# Patient Record
Sex: Female | Born: 1990 | Race: Black or African American | Hispanic: No | Marital: Single | Smoking: Never smoker
Health system: Southern US, Community
[De-identification: ages and names within clinical notes are randomized; demographics above are authoritative.]

## PROBLEM LIST (undated history)

## (undated) ENCOUNTER — Emergency Department (HOSPITAL_COMMUNITY): Discharge status: Against Medical Advice | Payer: Self-pay

## (undated) DIAGNOSIS — J45909 Unspecified asthma, uncomplicated: Secondary | ICD-10-CM

## (undated) DIAGNOSIS — R7303 Prediabetes: Secondary | ICD-10-CM

## (undated) HISTORY — PX: NO PAST SURGERIES: SHX2092

---
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
## 2012-07-30 ENCOUNTER — Encounter (HOSPITAL_COMMUNITY): Payer: Self-pay | Admitting: Emergency Medicine

## 2012-07-30 ENCOUNTER — Emergency Department (HOSPITAL_COMMUNITY)
Admission: EM | Admit: 2012-07-30 | Discharge: 2012-07-30 | Disposition: A | Discharge status: Home / Self Care | Payer: Self-pay | Attending: Emergency Medicine | Admitting: Emergency Medicine

## 2012-07-30 DIAGNOSIS — R059 Cough, unspecified: Secondary | ICD-10-CM | POA: Insufficient documentation

## 2012-07-30 DIAGNOSIS — R05 Cough: Secondary | ICD-10-CM | POA: Insufficient documentation

## 2012-07-30 DIAGNOSIS — J45901 Unspecified asthma with (acute) exacerbation: Secondary | ICD-10-CM | POA: Insufficient documentation

## 2012-07-30 DIAGNOSIS — J4531 Mild persistent asthma with (acute) exacerbation: Secondary | ICD-10-CM

## 2012-07-30 HISTORY — DX: Unspecified asthma, uncomplicated: J45.909

## 2012-07-30 MED ORDER — PREDNISONE 20 MG PO TABS: 60.0000 mg | ORAL_TABLET | Freq: Every day | ORAL | Status: DC

## 2012-07-30 MED ORDER — ALBUTEROL SULFATE HFA 108 (90 BASE) MCG/ACT IN AERS: 1.0000 | INHALATION_SPRAY | Freq: Four times a day (QID) | RESPIRATORY_TRACT | Status: DC | PRN

## 2012-07-30 MED ORDER — ALBUTEROL SULFATE HFA 108 (90 BASE) MCG/ACT IN AERS
2.0000 | INHALATION_SPRAY | Freq: Once | RESPIRATORY_TRACT | Status: AC
  Administered 2012-07-30: 2 via RESPIRATORY_TRACT
  Filled 2012-07-30: qty 6.7

## 2012-07-30 NOTE — ED Provider Notes (Signed)
   History    CSN: [id number] Arrival date & time 07/30/12  1221  First MD Initiated Contact with Patient 07/30/12 1238     Chief Complaint  Patient presents with  . Asthma   (Consider location/radiation/quality/duration/timing/severity/associated sxs/prior Treatment) HPI Comments: Nicole White is a patient with hx of asthma who presents with hx of worsening wheezing and dyspnea that started this past week gradually and has been constant.  She ran out of her inhaler yesterday.  She endorses some productive cough that she usually has with her asthma-exacerbations.  She denies ICU stays or intubations.  Albuterol improves her sx.  Nothing relieves them.  She denies any pain  Past Medical History  Diagnosis Date  . Asthma    No past surgical history on file. No family history on file. History  Substance Use Topics  . Smoking status: Never Smoker   . Smokeless tobacco: Never Used  . Alcohol Use: No   OB History   Grav Para Term Preterm Abortions TAB SAB Ect Mult Living                 Review of Systems  Unable to perform ROS Constitutional: Negative for diaphoresis and fatigue.  HENT: Negative for ear pain, congestion and rhinorrhea.   Eyes: Negative.   Respiratory:       See history of present illness  Cardiovascular: Negative.   Gastrointestinal: Negative.   Endocrine: Negative.   Genitourinary: Negative.   Musculoskeletal: Negative.   Skin: Negative.   Allergic/Immunologic: Negative.   Neurological: Negative.   Hematological: Negative.   Psychiatric/Behavioral: Negative.   All other systems reviewed and are negative.    Allergies  Review of patient's allergies indicates no known allergies.  Home Medications  No current outpatient prescriptions on file. BP 128/85  Pulse 93  Temp(Src) 98.3 F (36.8 C) (Oral)  Resp 20  SpO2 99%  LMP 06/30/2012 Physical Exam PHYSICAL EXAM  Nursing note and vitals reviewed. Constitutional: Patient is oriented to person,  place, and time. Patient appears well-developed and well-nourished. No distress.  Head: Normocephalic and atraumatic.  Right Ear: External ear normal.  Left Ear: External ear normal.  Eyes: Conjunctivae injected and EOM are normal. Pupils are equal, round, and reactive to light. No scleral icterus.  Neck: Normal range of motion. Neck supple. No JVD present.  Cardiovascular: Normal rate.  Nml rhythm Pulmonary/Chest: Effort normal. No respiratory distress.  Abd/GI: Patient exhibits no distension.  Musculoskeletal: Normal range of motion. Patient exhibits no edema.  Neurological: Patient is alert and oriented to person, place, and time.  Skin: Patient is not diaphoretic. No pallor.  Psychiatric: Patient has a normal mood and affect. Patient behavior is normal. Judgment and thought content normal.    ED Course  Procedures (including critical care time) Labs Reviewed - No data to display No results found. No diagnosis found.  MDM  Patient is with persistent asthmatic symptoms and has run out of her inhaler. She has no concerning findings on exam including fever tachypnea or tachycardia. She appears very well. Her lung sounds are clear, and I do not think that she has pneumonia. Will treat with by mouth prednisone and albuterol MDI. Given her resources for followup as well as strict return precautions.  Ashby Dawes, MD 07/30/12 1345

## 2012-07-30 NOTE — ED Notes (Signed)
Ran out of inhaler, asthma symptoms. Called EMS yesterday for same was given a breathing treatment and decided not to come to the hospital. EMS gave a breathing treatment today, reports clear lung sounds.

## 2012-08-06 ENCOUNTER — Emergency Department (HOSPITAL_COMMUNITY)
Admission: EM | Admit: 2012-08-06 | Discharge: 2012-08-06 | Disposition: A | Discharge status: Home / Self Care | Payer: Self-pay | Attending: Emergency Medicine | Admitting: Emergency Medicine

## 2012-08-06 ENCOUNTER — Encounter (HOSPITAL_COMMUNITY): Payer: Self-pay | Admitting: Emergency Medicine

## 2012-08-06 DIAGNOSIS — J4521 Mild intermittent asthma with (acute) exacerbation: Secondary | ICD-10-CM

## 2012-08-06 DIAGNOSIS — Z79899 Other long term (current) drug therapy: Secondary | ICD-10-CM | POA: Insufficient documentation

## 2012-08-06 DIAGNOSIS — Z760 Encounter for issue of repeat prescription: Secondary | ICD-10-CM | POA: Insufficient documentation

## 2012-08-06 DIAGNOSIS — J45901 Unspecified asthma with (acute) exacerbation: Secondary | ICD-10-CM | POA: Insufficient documentation

## 2012-08-06 MED ORDER — ALBUTEROL SULFATE HFA 108 (90 BASE) MCG/ACT IN AERS
2.0000 | INHALATION_SPRAY | RESPIRATORY_TRACT | Status: DC
  Administered 2012-08-06: 2 via RESPIRATORY_TRACT
  Filled 2012-08-06: qty 6.7

## 2012-08-06 MED ORDER — ALBUTEROL SULFATE HFA 108 (90 BASE) MCG/ACT IN AERS: 1.0000 | INHALATION_SPRAY | Freq: Four times a day (QID) | RESPIRATORY_TRACT | Status: DC | PRN

## 2012-08-06 MED ORDER — ALBUTEROL SULFATE (5 MG/ML) 0.5% IN NEBU
2.5000 mg | INHALATION_SOLUTION | Freq: Once | RESPIRATORY_TRACT | Status: AC
  Administered 2012-08-06: 2.5 mg via RESPIRATORY_TRACT
  Filled 2012-08-06: qty 0.5

## 2012-08-06 MED ORDER — PREDNISONE 20 MG PO TABS
60.0000 mg | ORAL_TABLET | Freq: Once | ORAL | Status: AC
  Administered 2012-08-06: 60 mg via ORAL
  Filled 2012-08-06: qty 3

## 2012-08-06 MED ORDER — PREDNISONE 10 MG PO TABS: ORAL_TABLET | ORAL | Status: DC

## 2012-08-06 NOTE — ED Notes (Signed)
Lung fields improved after breathing treatment. Clear throughout.

## 2012-08-06 NOTE — ED Notes (Signed)
PA at bedside.

## 2012-08-06 NOTE — ED Provider Notes (Signed)
This chart was scribed for non-physician practitioner Trisha Mangle, PA-C working with Derwood Kaplan, MD, by Candelaria Stagers, ED Scribe. This patient was seen in room TR09C/TR09C and the patient's care was started at 5:45 PM  CSN: [id number]     Arrival date & time 08/06/12  1433 History     First MD Initiated Contact with Patient 08/06/12 1741     Chief Complaint  Patient presents with  . Medication Refill    The history is provided by the patient. No language interpreter was used.   HPI Comments: Nicole White is a 22 y.o. female with h/o asthma who presents to the Emergency Department complaining of trouble breathing over the last few days reporting she has been out of her prescribed albuterol.  Pt is unable to get the albuterol refilled due to cost.  She is experiencing wheezing.  Nothing seems to make the sx better or worse.      No PCP  Past Medical History  Diagnosis Date  . Asthma    History reviewed. No pertinent past surgical history. No family history on file. History  Substance Use Topics  . Smoking status: Never Smoker   . Smokeless tobacco: Never Used  . Alcohol Use: No   OB History   Grav Para Term Preterm Abortions TAB SAB Ect Mult Living                 Review of Systems  Respiratory: Positive for shortness of breath and wheezing.   All other systems reviewed and are negative.    Allergies  Peanut-containing drug products and Coconut flavor  Home Medications   Current Outpatient Rx  Name  Route  Sig  Dispense  Refill  . albuterol (PROVENTIL HFA;VENTOLIN HFA) 108 (90 BASE) MCG/ACT inhaler   Inhalation   Inhale 1-2 puffs into the lungs every 6 (six) hours as needed for wheezing.   1 Inhaler   0   . predniSONE (DELTASONE) 20 MG tablet   Oral   Take 3 tablets (60 mg total) by mouth daily.   15 tablet   0    BP 139/78  Pulse 100  Temp(Src) 98.3 F (36.8 C) (Oral)  Resp 18  Ht 5' (1.524 m)  Wt 126 lb (57.153 kg)  BMI 24.61 kg/m2  LMP  06/30/2012  Physical Exam  Nursing note and vitals reviewed. Constitutional: She is oriented to person, place, and time. She appears well-developed and well-nourished. No distress.  HENT:  Head: Normocephalic and atraumatic.  Eyes: EOM are normal.  Neck: Neck supple. No tracheal deviation present.  Cardiovascular: Normal rate.   Pulmonary/Chest: Effort normal. No respiratory distress. She has wheezes (wheezes to all lobes).  Musculoskeletal: Normal range of motion.  Neurological: She is alert and oriented to person, place, and time.  Skin: Skin is warm and dry.  Psychiatric: She has a normal mood and affect. Her behavior is normal.    ED Course   Procedures  DIAGNOSTIC STUDIES: Oxygen Saturation is 100% normal by my interpretation.    COORDINATION OF CARE:    5:46 PM Discussed course of care with pt which includes albuterol inhaler.  Pt understands and agrees.   Labs Reviewed - No data to display No results found. 1. Asthma exacerbation attacks, mild intermittent     MDM   I personally performed the services in this documentation, which was scribed in my presence.  The recorded information has been reviewed and considered.   Barnet Pall.   Lonia Skinner  [place], [place] 08/06/12 1804

## 2012-08-06 NOTE — ED Notes (Signed)
Pt. Stated, I've had trouble with my breathing since I've been out of my inhaler.  I don't have insurance and its too expensive.  My breathing seems to be triggered by all kinds of different things even laughing.

## 2012-08-06 NOTE — ED Notes (Signed)
Pt. Here on July 20 for the same reason and did not refill her RX.

## 2012-08-06 NOTE — ED Notes (Signed)
Patient states unable to get her medications filled due to no insurance and no money. Requesting samples. Educated her that the ER does not have samples, but will get Case Management to come talk with her about assistance options.

## 2012-08-07 NOTE — ED Provider Notes (Signed)
Medical screening examination/treatment/procedure(s) were performed by non-physician practitioner and as supervising physician I was immediately available for consultation/collaboration.  Breylen Agyeman, MD 08/07/12 0033 

## 2012-08-16 ENCOUNTER — Emergency Department (INDEPENDENT_AMBULATORY_CARE_PROVIDER_SITE_OTHER)
Admission: EM | Admit: 2012-08-16 | Discharge: 2012-08-16 | Disposition: A | Discharge status: Home / Self Care | Payer: Self-pay | Source: Home / Self Care

## 2012-08-16 ENCOUNTER — Encounter (HOSPITAL_COMMUNITY): Payer: Self-pay

## 2012-08-16 DIAGNOSIS — J45901 Unspecified asthma with (acute) exacerbation: Secondary | ICD-10-CM

## 2012-08-16 DIAGNOSIS — J45909 Unspecified asthma, uncomplicated: Secondary | ICD-10-CM

## 2012-08-16 LAB — POCT PREGNANCY, URINE: Preg Test, Ur: NEGATIVE

## 2012-08-16 LAB — POCT URINALYSIS DIP (DEVICE)
Bilirubin Urine: NEGATIVE
Ketones, ur: NEGATIVE mg/dL

## 2012-08-16 MED ORDER — ALBUTEROL SULFATE (5 MG/ML) 0.5% IN NEBU
2.5000 mg | INHALATION_SOLUTION | Freq: Once | RESPIRATORY_TRACT | Status: AC
  Administered 2012-08-16: 2.5 mg via RESPIRATORY_TRACT

## 2012-08-16 MED ORDER — PREDNISONE 20 MG PO TABS: 40.0000 mg | ORAL_TABLET | Freq: Every day | ORAL | Status: DC

## 2012-08-16 MED ORDER — ALBUTEROL SULFATE (5 MG/ML) 0.5% IN NEBU
INHALATION_SOLUTION | RESPIRATORY_TRACT | Status: AC
  Filled 2012-08-16: qty 0.5

## 2012-08-16 MED ORDER — ALBUTEROL SULFATE HFA 108 (90 BASE) MCG/ACT IN AERS: 1.0000 | INHALATION_SPRAY | Freq: Four times a day (QID) | RESPIRATORY_TRACT | Status: DC | PRN

## 2012-08-16 NOTE — ED Notes (Signed)
C/o her asthma is flarring up; has Rx , but has no insurance, and did not fill

## 2012-08-16 NOTE — ED Provider Notes (Signed)
Nicole White is a 22 y.o. female who presents to Urgent Care today for asthma. Patient has a past medical history significant for asthma. She's had multiple frequent asthma exacerbations in the last month or 2. She frequently goes to the emergency room for breathing treatments. However she does not have health insurance and has not filled her prednisone or follow up with the indigent care clinic. She is back today with wheezing and mild shortness of breath. She denies any chest pains or palpitations and feels well otherwise. Her current symptoms are consistent with asthma exacerbation. She has run out of her sample albuterol inhaler.     PMH reviewed. Asthma  History  Substance Use Topics  . Smoking status: Never Smoker   . Smokeless tobacco: Never Used  . Alcohol Use: No   ROS as above Medications reviewed. No current facility-administered medications for this encounter.   Current Outpatient Prescriptions  Medication Sig Dispense Refill  . albuterol (PROVENTIL HFA;VENTOLIN HFA) 108 (90 BASE) MCG/ACT inhaler Inhale 1-2 puffs into the lungs every 6 (six) hours as needed for wheezing.  1 Inhaler  2  . predniSONE (DELTASONE) 20 MG tablet Take 2 tablets (40 mg total) by mouth daily.  20 tablet  0    Exam:  BP 108/62  Pulse 75  Temp(Src) 98.2 F (36.8 C) (Oral)  Resp 16  SpO2 97%  LMP 06/30/2012 Gen: Well NAD HEENT: EOMI,  MMM Lungs:  decreased breath sounds with expiratory wheezing. Normal work of breathing  Heart: RRR no MRG Abd: NABS, NT, ND Exts: Non edematous BL  LE, warm and well perfused.   Results for orders placed during the hospital encounter of 08/16/12 (from the past 24 hour(s))  POCT URINALYSIS DIP (DEVICE)     Status: Abnormal   Collection Time    08/16/12 11:18 AM      Result Value Range   Glucose, UA NEGATIVE  NEGATIVE mg/dL   Bilirubin Urine NEGATIVE  NEGATIVE   Ketones, ur NEGATIVE  NEGATIVE mg/dL   Specific Gravity, Urine >=1.030  1.005 - 1.030   Hgb urine  dipstick TRACE (*) NEGATIVE   pH 6.0  5.0 - 8.0   Protein, ur NEGATIVE  NEGATIVE mg/dL   Urobilinogen, UA 0.2  0.0 - 1.0 mg/dL   Nitrite NEGATIVE  NEGATIVE   Leukocytes, UA TRACE (*) NEGATIVE  POCT PREGNANCY, URINE     Status: None   Collection Time    08/16/12 11:20 AM      Result Value Range   Preg Test, Ur NEGATIVE  NEGATIVE   No results found.  Patient had subjective and objective improvement of her symptoms and physical exam findings with albuterol nebulizer treatment.  Assessment and Plan: 22 y.o. female with had a lengthy discussion with the patient regarding the importance of followup for asthma.  I informed her that prednisone is quite cheap and she should be able to afford it. Additionally discussed the importance of filling her albuterol inhaler if possible.  However I suspect the most of my time discussing the importance of proper followup. I've referred her to the indigent care clinic and stress the importance of follow up.  Discussed the pathophysiology of asthma and why controller medications and prednisone are very important.  Discussed warning signs or symptoms. Please see discharge instructions. Patient expresses understanding.      Rodolph Bong, MD 08/16/12 [phone]

## 2012-09-24 ENCOUNTER — Emergency Department (HOSPITAL_COMMUNITY): Payer: Self-pay

## 2012-09-24 ENCOUNTER — Encounter (HOSPITAL_COMMUNITY): Payer: Self-pay

## 2012-09-24 ENCOUNTER — Emergency Department (HOSPITAL_COMMUNITY)
Admission: EM | Admit: 2012-09-24 | Discharge: 2012-09-24 | Disposition: A | Discharge status: Home / Self Care | Payer: Self-pay | Attending: Emergency Medicine | Admitting: Emergency Medicine

## 2012-09-24 DIAGNOSIS — R059 Cough, unspecified: Secondary | ICD-10-CM | POA: Insufficient documentation

## 2012-09-24 DIAGNOSIS — M79641 Pain in right hand: Secondary | ICD-10-CM

## 2012-09-24 DIAGNOSIS — M25549 Pain in joints of unspecified hand: Secondary | ICD-10-CM | POA: Insufficient documentation

## 2012-09-24 DIAGNOSIS — Z79899 Other long term (current) drug therapy: Secondary | ICD-10-CM | POA: Insufficient documentation

## 2012-09-24 DIAGNOSIS — J45901 Unspecified asthma with (acute) exacerbation: Secondary | ICD-10-CM | POA: Insufficient documentation

## 2012-09-24 DIAGNOSIS — R0789 Other chest pain: Secondary | ICD-10-CM | POA: Insufficient documentation

## 2012-09-24 DIAGNOSIS — R05 Cough: Secondary | ICD-10-CM | POA: Insufficient documentation

## 2012-09-24 DIAGNOSIS — Z3202 Encounter for pregnancy test, result negative: Secondary | ICD-10-CM | POA: Insufficient documentation

## 2012-09-24 MED ORDER — PREDNISONE 20 MG PO TABS: 60.0000 mg | ORAL_TABLET | Freq: Every day | ORAL | Status: DC

## 2012-09-24 MED ORDER — PREDNISONE 20 MG PO TABS
60.0000 mg | ORAL_TABLET | Freq: Once | ORAL | Status: AC
  Administered 2012-09-24: 60 mg via ORAL
  Filled 2012-09-24: qty 3

## 2012-09-24 NOTE — ED Notes (Addendum)
Pt states she awakened this morning @ 9 am with SOB.  Pt did not have an albuterol inhaler at home so did not self-medicate. Pt in NAD at this time.  Pt's lung sounds are clear in lower lobes with end expiratory wheezing in bilat lower lobes.  Good peripheral pulses in all 4 extremities.  Pt denies N/V/D and fever.  Pt states she has occasional cough productive of gray sputum.  Pt c/o pain in proximal knuckle of right index finger.
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
## 2012-09-24 NOTE — ED Notes (Signed)
Per EMS-Patient was seen by EMS las night for asthma exacerbation and was given an Albuterol treatment. Patient refused transport at that time. Today, patient was wheezing and called EMS. Patient received albuterol and Atrovent x 2 prior to arrival to the ED.

## 2012-10-29 ENCOUNTER — Emergency Department (HOSPITAL_COMMUNITY)
Admission: EM | Admit: 2012-10-29 | Discharge: 2012-10-29 | Disposition: A | Discharge status: Home / Self Care | Payer: Self-pay | Attending: Emergency Medicine | Admitting: Emergency Medicine

## 2012-10-29 ENCOUNTER — Encounter (HOSPITAL_COMMUNITY): Payer: Self-pay | Admitting: Emergency Medicine

## 2012-10-29 ENCOUNTER — Emergency Department (HOSPITAL_COMMUNITY): Payer: Self-pay

## 2012-10-29 DIAGNOSIS — J45901 Unspecified asthma with (acute) exacerbation: Secondary | ICD-10-CM | POA: Insufficient documentation

## 2012-10-29 DIAGNOSIS — Z79899 Other long term (current) drug therapy: Secondary | ICD-10-CM | POA: Insufficient documentation

## 2012-10-29 DIAGNOSIS — R0789 Other chest pain: Secondary | ICD-10-CM | POA: Insufficient documentation

## 2012-10-29 DIAGNOSIS — IMO0002 Reserved for concepts with insufficient information to code with codable children: Secondary | ICD-10-CM | POA: Insufficient documentation

## 2012-10-29 MED ORDER — PREDNISONE 20 MG PO TABS: 40.0000 mg | ORAL_TABLET | Freq: Every day | ORAL | Status: DC

## 2012-10-29 MED ORDER — ALBUTEROL SULFATE HFA 108 (90 BASE) MCG/ACT IN AERS
2.0000 | INHALATION_SPRAY | Freq: Once | RESPIRATORY_TRACT | Status: AC
  Administered 2012-10-29: 2 via RESPIRATORY_TRACT
  Filled 2012-10-29: qty 6.7

## 2012-10-29 NOTE — ED Notes (Signed)
She phoned EMS d/t "wheezing real bad".  They had been to see her already this morning, and after a nebulizer treatment, she decided she felt better and did not come to hospital at that time.  She further tells [place] she "ran out of my (aluterol) inhaler a while ago".  She is finishing an albuterol/atrovent h.h.n. Upon arrival in E.D. Her skin is normal, warm and dry, and she is in no distress.

## 2012-10-29 NOTE — ED Notes (Signed)
She milingly tell me she feels "much better".

## 2012-10-29 NOTE — ED Notes (Signed)
Bed: WA06 Expected date:  Expected time:  Means of arrival:  Comments: 22 y/o F asthma

## 2012-11-02 NOTE — ED Provider Notes (Signed)
CSN: [id number]     Arrival date & time 10/29/12  1403 History   First MD Initiated Contact with Patient 10/29/12 1408     Chief Complaint  Patient presents with  . Asthma   (Consider location/radiation/quality/duration/timing/severity/associated sxs/prior Treatment) HPI  22 year old female with shortness of breath. Patient has a past history of asthma. She states that her current symptoms feel similar. He states that this morning she began having severe wheezing. Initially called EMS received an albuterol treatment and declined transport to the emergency room. She began feeling poorly again surgical EMS back. Productive cough. No fevers or chills. No unusual leg pain or swelling. Has never been to the ICU or intubated because of her asthma. Some mild chest tightness, but not really pain. No sick contacts. Denies drug use.  Past Medical History  Diagnosis Date  . Asthma    History reviewed. No pertinent past surgical history. Family History  Problem Relation Age of Onset  . Asthma Father   . Autism Brother    History  Substance Use Topics  . Smoking status: Never Smoker   . Smokeless tobacco: Never Used  . Alcohol Use: No   OB History   Grav Para Term Preterm Abortions TAB SAB Ect Mult Living                 Review of Systems  All systems reviewed and negative, other than as noted in HPI.   Allergies  Peanut-containing drug products and Coconut flavor  Home Medications   Current Outpatient Rx  Name  Route  Sig  Dispense  Refill  . albuterol (PROVENTIL HFA;VENTOLIN HFA) 108 (90 BASE) MCG/ACT inhaler   Inhalation   Inhale 1-2 puffs into the lungs every 6 (six) hours as needed for wheezing.   1 Inhaler   2   . ibuprofen (ADVIL,MOTRIN) 200 MG tablet   Oral   Take 400 mg by mouth every 6 (six) hours as needed for pain.         . predniSONE (DELTASONE) 20 MG tablet   Oral   Take 2 tablets (40 mg total) by mouth daily.   10 tablet   0    BP 130/76  Pulse 70   Temp(Src) 98 F (36.7 C) (Oral)  Resp 16  SpO2 100% Physical Exam  Nursing note and vitals reviewed. Constitutional: She appears well-developed and well-nourished. No distress.  HENT:  Head: Normocephalic and atraumatic.  Eyes: Conjunctivae are normal. Right eye exhibits no discharge. Left eye exhibits no discharge.  Neck: Neck supple.  Cardiovascular: Normal rate, regular rhythm and normal heart sounds.  Exam reveals no gallop and no friction rub.   No murmur heard. Pulmonary/Chest: Effort normal and breath sounds normal. No respiratory distress.  Abdominal: Soft. She exhibits no distension. There is no tenderness.  Musculoskeletal: She exhibits no edema and no tenderness.  Lower extremities symmetric as compared to each other. No calf tenderness. Negative Homan's. No palpable cords.   Neurological: She is alert.  Skin: Skin is warm and dry.  Psychiatric: She has a normal mood and affect. Her behavior is normal. Thought content normal.    ED Course  Procedures (including critical care time) Labs Review Labs Reviewed - No data to display Imaging Review No results found.  Dg Chest 2 View  10/29/2012   CLINICAL DATA:  22 year old female with chest pain, shortness of breath. Initial encounter.  EXAM: CHEST  2 VIEW  COMPARISON:  09/24/2012 and earlier.  FINDINGS: Hair artifact. Lung  volumes are stable and within normal limits. Normal cardiac size and mediastinal contours. Visualized tracheal air column is within normal limits. The lungs are clear. No pneumothorax or effusion. No osseous abnormality identified.  IMPRESSION: Negative, no acute cardiopulmonary abnormality.   Electronically Signed   By: Augusto Gamble M.D.   On: 10/29/2012 14:47   EKG Interpretation   None       MDM   1. Asthma exacerbation    22 year old female presenting for her what I suspect is to obtain an albuterol inhaler. She states that she is currently out. She has a history of asthma and perhaps some mild  wheezing on her exam here today. She has been her breathing is generally very well appearing. Inhaler was provided to her. Short course of steroids. Discussed the need for a primary care provider. Resources were discussed.    Raeford Razor, MD 11/02/12 1355

## 2012-11-20 ENCOUNTER — Emergency Department (HOSPITAL_COMMUNITY)
Admission: EM | Admit: 2012-11-20 | Discharge: 2012-11-20 | Disposition: A | Discharge status: Home / Self Care | Payer: Self-pay | Attending: Emergency Medicine | Admitting: Emergency Medicine

## 2012-11-20 ENCOUNTER — Encounter (HOSPITAL_COMMUNITY): Payer: Self-pay | Admitting: Emergency Medicine

## 2012-11-20 ENCOUNTER — Emergency Department (HOSPITAL_COMMUNITY): Payer: Self-pay

## 2012-11-20 DIAGNOSIS — J45901 Unspecified asthma with (acute) exacerbation: Secondary | ICD-10-CM | POA: Insufficient documentation

## 2012-11-20 DIAGNOSIS — J3489 Other specified disorders of nose and nasal sinuses: Secondary | ICD-10-CM | POA: Insufficient documentation

## 2012-11-20 DIAGNOSIS — Z79899 Other long term (current) drug therapy: Secondary | ICD-10-CM | POA: Insufficient documentation

## 2012-11-20 MED ORDER — PREDNISONE 20 MG PO TABS: 40.0000 mg | ORAL_TABLET | Freq: Every day | ORAL | Status: DC

## 2012-11-20 MED ORDER — ALBUTEROL SULFATE HFA 108 (90 BASE) MCG/ACT IN AERS: 2.0000 | INHALATION_SPRAY | Freq: Once | RESPIRATORY_TRACT | Status: DC

## 2012-11-20 MED ORDER — ALBUTEROL SULFATE HFA 108 (90 BASE) MCG/ACT IN AERS: 2.0000 | INHALATION_SPRAY | RESPIRATORY_TRACT | Status: DC | PRN

## 2012-11-20 MED ORDER — ALBUTEROL SULFATE HFA 108 (90 BASE) MCG/ACT IN AERS: 1.0000 | INHALATION_SPRAY | Freq: Four times a day (QID) | RESPIRATORY_TRACT | Status: DC | PRN

## 2012-11-20 MED ORDER — ALBUTEROL SULFATE HFA 108 (90 BASE) MCG/ACT IN AERS
INHALATION_SPRAY | RESPIRATORY_TRACT | Status: AC
  Filled 2012-11-20: qty 6.7

## 2012-11-20 MED ORDER — ALBUTEROL SULFATE (5 MG/ML) 0.5% IN NEBU
2.5000 mg | INHALATION_SOLUTION | RESPIRATORY_TRACT | Status: AC
  Administered 2012-11-20 (×3): 2.5 mg via RESPIRATORY_TRACT
  Filled 2012-11-20 (×3): qty 0.5

## 2012-11-20 MED ORDER — PREDNISONE 20 MG PO TABS: ORAL_TABLET | ORAL | Status: DC

## 2012-11-20 MED ORDER — PREDNISONE 20 MG PO TABS
60.0000 mg | ORAL_TABLET | Freq: Once | ORAL | Status: AC
  Administered 2012-11-20: 60 mg via ORAL
  Filled 2012-11-20: qty 3

## 2012-11-20 MED ORDER — IPRATROPIUM BROMIDE 0.02 % IN SOLN
0.5000 mg | RESPIRATORY_TRACT | Status: DC
  Administered 2012-11-20: 0.5 mg via RESPIRATORY_TRACT
  Filled 2012-11-20: qty 2.5

## 2012-11-20 MED ORDER — ALBUTEROL SULFATE (5 MG/ML) 0.5% IN NEBU
2.5000 mg | INHALATION_SOLUTION | RESPIRATORY_TRACT | Status: DC
  Administered 2012-11-20: 2.5 mg via RESPIRATORY_TRACT
  Filled 2012-11-20: qty 0.5

## 2012-11-20 MED ORDER — IPRATROPIUM BROMIDE 0.02 % IN SOLN
0.5000 mg | RESPIRATORY_TRACT | Status: AC
  Administered 2012-11-20 (×3): 0.5 mg via RESPIRATORY_TRACT
  Filled 2012-11-20 (×3): qty 2.5

## 2012-11-20 NOTE — ED Notes (Signed)
Bed: WA06 Expected date:  Expected time:  Means of arrival:  Comments: 22yo-asthma

## 2012-11-20 NOTE — ED Notes (Signed)
Per EMS pt c/o asthma, given a/a neb tx, expiratory wheezing noted, no distress

## 2012-11-20 NOTE — Progress Notes (Signed)
[facility] provided pt with a list of primary care resources and a [facility] [employer] application.  °

## 2012-11-20 NOTE — ED Provider Notes (Signed)
CSN: [id number]     Arrival date & time 11/20/12  4098 History   First MD Initiated Contact with Patient 11/20/12 [phone]     Chief Complaint  Patient presents with  . Asthma   (Consider location/radiation/quality/duration/timing/severity/associated sxs/prior Treatment) Patient is a 22 y.o. female presenting with asthma. The history is provided by the patient.  Asthma This is a chronic problem. The current episode started 1 to 2 hours ago. The problem occurs constantly. The problem has not changed since onset.Associated symptoms include shortness of breath. Pertinent negatives include no chest pain and no abdominal pain. Nothing aggravates the symptoms. Nothing relieves the symptoms.    Past Medical History  Diagnosis Date  . Asthma    History reviewed. No pertinent past surgical history. Family History  Problem Relation Age of Onset  . Asthma Father   . Autism Brother    History  Substance Use Topics  . Smoking status: Never Smoker   . Smokeless tobacco: Never Used  . Alcohol Use: No   OB History   Grav Para Term Preterm Abortions TAB SAB Ect Mult Living                 Review of Systems  Constitutional: Negative for fever.  HENT: Positive for congestion and rhinorrhea.   Respiratory: Positive for cough, shortness of breath and wheezing.   Cardiovascular: Negative for chest pain and leg swelling.  Gastrointestinal: Negative for vomiting and abdominal pain.  All other systems reviewed and are negative.    Allergies  Peanut-containing drug products and Coconut flavor  Home Medications   Current Outpatient Rx  Name  Route  Sig  Dispense  Refill  . albuterol (PROVENTIL HFA;VENTOLIN HFA) 108 (90 BASE) MCG/ACT inhaler   Inhalation   Inhale 1-2 puffs into the lungs every 6 (six) hours as needed for wheezing.   2 Inhaler   1   . predniSONE (DELTASONE) 20 MG tablet   Oral   Take 2 tablets (40 mg total) by mouth daily.   8 tablet   0    BP 123/73  Pulse 110   Temp(Src) 98.1 F (36.7 C) (Oral)  Resp 18  SpO2 95% Physical Exam  Nursing note and vitals reviewed. Constitutional: She is oriented to person, place, and time. She appears well-developed and well-nourished. No distress.  HENT:  Head: Normocephalic and atraumatic.  Eyes: EOM are normal. Pupils are equal, round, and reactive to light.  Neck: Normal range of motion. Neck supple.  Cardiovascular: Normal rate and regular rhythm.  Exam reveals no friction rub.   No murmur heard. Pulmonary/Chest: Effort normal. No respiratory distress. She has wheezes (expiratory, diffuse). She has no rales.  Abdominal: Soft. She exhibits no distension. There is no tenderness. There is no rebound.  Musculoskeletal: Normal range of motion. She exhibits no edema.  Neurological: She is alert and oriented to person, place, and time. No cranial nerve deficit. She exhibits normal muscle tone.  Skin: She is not diaphoretic.    ED Course  Procedures (including critical care time) Labs Review Labs Reviewed - No data to display Imaging Review Dg Chest 2 View  11/20/2012   CLINICAL DATA:  Cough.  EXAM: CHEST  2 VIEW  COMPARISON:  10/29/2012.  FINDINGS: The heart size and mediastinal contours are within normal limits. Both lungs are clear. The visualized skeletal structures are unremarkable.  IMPRESSION: No active cardiopulmonary disease.   Electronically Signed   By: Maisie Fus  Register   On: 11/20/2012 11:11  EKG Interpretation   None       MDM   1. Asthma exacerbation    57F with hx of asthma presents with SOB and wheezing. Began today while walking in the cold to the bus stop. Sinus congestion and URI symptoms of rhinorrhea, cough for past 2 days. No fevers, no productive cough. States she's used up her inhaler at home. Here afebrile, normotensive. Normal oxygen sats on room air. Patient talking in complete sentences. Patient has expiratory wheezing, no retractions. Will give steroids, multiple duo  nebs. Wheezing improving, however on re-exam, worse on R than L. CXR without pneumonia. Given steroids and albuterol inhaler to go home.   Dagmar Hait, MD 11/20/12 [phone]

## 2012-12-17 ENCOUNTER — Emergency Department (HOSPITAL_COMMUNITY)
Admission: EM | Admit: 2012-12-17 | Discharge: 2012-12-17 | Disposition: A | Discharge status: Home / Self Care | Payer: Self-pay | Attending: Emergency Medicine | Admitting: Emergency Medicine

## 2012-12-17 ENCOUNTER — Encounter (HOSPITAL_COMMUNITY): Payer: Self-pay | Admitting: Emergency Medicine

## 2012-12-17 DIAGNOSIS — J45901 Unspecified asthma with (acute) exacerbation: Secondary | ICD-10-CM | POA: Insufficient documentation

## 2012-12-17 DIAGNOSIS — J020 Streptococcal pharyngitis: Secondary | ICD-10-CM

## 2012-12-17 DIAGNOSIS — R509 Fever, unspecified: Secondary | ICD-10-CM | POA: Insufficient documentation

## 2012-12-17 DIAGNOSIS — Z79899 Other long term (current) drug therapy: Secondary | ICD-10-CM | POA: Insufficient documentation

## 2012-12-17 DIAGNOSIS — J029 Acute pharyngitis, unspecified: Secondary | ICD-10-CM | POA: Insufficient documentation

## 2012-12-17 LAB — RAPID STREP SCREEN (MED CTR MEBANE ONLY): Streptococcus, Group A Screen (Direct): NEGATIVE

## 2012-12-17 MED ORDER — ALBUTEROL SULFATE (5 MG/ML) 0.5% IN NEBU
5.0000 mg | INHALATION_SOLUTION | Freq: Once | RESPIRATORY_TRACT | Status: AC
  Administered 2012-12-17: 5 mg via RESPIRATORY_TRACT
  Filled 2012-12-17: qty 1

## 2012-12-17 MED ORDER — IPRATROPIUM BROMIDE 0.02 % IN SOLN
0.5000 mg | Freq: Once | RESPIRATORY_TRACT | Status: AC
  Administered 2012-12-17: 0.5 mg via RESPIRATORY_TRACT
  Filled 2012-12-17: qty 2.5

## 2012-12-17 MED ORDER — DEXTROMETHORPHAN POLISTIREX 30 MG/5ML PO LQCR: 15.0000 mg | ORAL | Status: DC | PRN

## 2012-12-17 MED ORDER — ALBUTEROL SULFATE HFA 108 (90 BASE) MCG/ACT IN AERS
2.0000 | INHALATION_SPRAY | Freq: Once | RESPIRATORY_TRACT | Status: AC
  Administered 2012-12-17: 2 via RESPIRATORY_TRACT
  Filled 2012-12-17: qty 6.7

## 2012-12-17 MED ORDER — PENICILLIN G BENZATHINE 1200000 UNIT/2ML IM SUSP
1.2000 10*6.[IU] | Freq: Once | INTRAMUSCULAR | Status: AC
  Administered 2012-12-17: 1.2 10*6.[IU] via INTRAMUSCULAR
  Filled 2012-12-17: qty 2

## 2012-12-17 MED ORDER — PREDNISONE 20 MG PO TABS
40.0000 mg | ORAL_TABLET | Freq: Once | ORAL | Status: AC
  Administered 2012-12-17: 40 mg via ORAL
  Filled 2012-12-17: qty 2

## 2012-12-17 MED ORDER — PREDNISONE 20 MG PO TABS: 40.0000 mg | ORAL_TABLET | Freq: Every day | ORAL | Status: DC

## 2012-12-17 NOTE — ED Notes (Addendum)
Pt has enlarged red areas in her throat. She reports that it hurts to swallow. Pt also reports that she was seen one month ago for sore throat she left AMA and was later contacted that her strep screen was positive. She was not treated.

## 2012-12-17 NOTE — ED Notes (Signed)
PA at bedside.

## 2012-12-17 NOTE — ED Notes (Signed)
Respiratory called for breathing treatment.

## 2012-12-17 NOTE — ED Notes (Signed)
Per EMS - pt called EMS because she was having SOB.  Pt has cough productive of gray/yellow sputum.  Pt has hx of asthma.  Pt has wheezing in all fields.  Pt received Albuterol 5 mg nebulized en route with EMS.  Pt's BP was 108/76, HR 102, RR 18 on scene.

## 2012-12-17 NOTE — ED Notes (Signed)
Pt's lung sounds are diminished.  No wheezing noted.

## 2012-12-17 NOTE — ED Provider Notes (Signed)
CSN: [id number]     Arrival date & time 12/17/12  1308 History   First MD Initiated Contact with Patient 12/17/12 1456     Chief Complaint  Patient presents with  . Wheezing  . Sore Throat   (Consider location/radiation/quality/duration/timing/severity/associated sxs/prior Treatment) HPI Comments: Patient is a 22 year old female with a history of asthma who presents with a 2 day history of sore throat. Patient reports gradual onset and progressively worsening sharp, severe throat pain. The pain is constant and made worse with swallowing. The pain is localized to the patient's throat and equal on both sides. Nothing alleviates the pain. The patient has not tried anything for symptom relief. Patient reports associated subjective fever, cervical adenopathy, and productive cough with yellow sputum. Patient denies headache, visual changes, sinus congestion, chest pain, SOB, abdominal pain, NVD. Patient also reports wheezing which feels like an asthma exacerbation. She reports being out of her rescue inhaler.      Past Medical History  Diagnosis Date  . Asthma    History reviewed. No pertinent past surgical history. Family History  Problem Relation Age of Onset  . Asthma Father   . Autism Brother    History  Substance Use Topics  . Smoking status: Never Smoker   . Smokeless tobacco: Never Used  . Alcohol Use: No   OB History   Grav Para Term Preterm Abortions TAB SAB Ect Mult Living                 Review of Systems  Constitutional: Negative for fever, chills and fatigue.  HENT: Positive for sore throat. Negative for trouble swallowing.   Eyes: Negative for visual disturbance.  Respiratory: Positive for shortness of breath and wheezing.   Cardiovascular: Negative for chest pain and palpitations.  Gastrointestinal: Negative for nausea, vomiting, abdominal pain and diarrhea.  Genitourinary: Negative for dysuria and difficulty urinating.  Musculoskeletal: Negative for arthralgias  and neck pain.  Skin: Negative for color change.  Neurological: Negative for dizziness and weakness.  Psychiatric/Behavioral: Negative for dysphoric mood.    Allergies  Peanut-containing drug products and Coconut flavor  Home Medications   Current Outpatient Rx  Name  Route  Sig  Dispense  Refill  . albuterol (PROVENTIL HFA;VENTOLIN HFA) 108 (90 BASE) MCG/ACT inhaler   Inhalation   Inhale 1-2 puffs into the lungs every 6 (six) hours as needed for wheezing.   2 Inhaler   1   . guaiFENesin (ROBITUSSIN) 100 MG/5ML liquid   Oral   Take 200 mg by mouth 3 (three) times daily as needed for cough.         . Pseudoeph-Doxylamine-DM-APAP (NYQUIL PO)   Oral   Take 30 mLs by mouth at bedtime as needed (for cold symptoms).          BP 102/69  Pulse 79  Temp(Src) 97.8 F (36.6 C) (Oral)  Resp 18  SpO2 100%  LMP 09/11/2012 Physical Exam  Nursing note and vitals reviewed. Constitutional: She is oriented to person, place, and time. She appears well-developed and well-nourished. No distress.  HENT:  Head: Normocephalic and atraumatic.  Mouth/Throat: No oropharyngeal exudate.  Bilateral tonsillar edema and erythema without exudate.   Eyes: Conjunctivae and EOM are normal. Pupils are equal, round, and reactive to light.  Neck: Normal range of motion.  Cardiovascular: Normal rate and regular rhythm.  Exam reveals no gallop and no friction rub.   No murmur heard. Pulmonary/Chest: Effort normal and breath sounds normal. She has no  wheezes. She has no rales. She exhibits no tenderness.  Abdominal: Soft. She exhibits no distension. There is no tenderness. There is no rebound.  Musculoskeletal: Normal range of motion.  Lymphadenopathy:    She has cervical adenopathy.  Neurological: She is alert and oriented to person, place, and time. Coordination normal.  Speech is goal-oriented. Moves limbs without ataxia.   Skin: Skin is warm and dry.  Psychiatric: She has a normal mood and affect.  Her behavior is normal.    ED Course  Procedures (including critical care time) Labs Review Labs Reviewed  RAPID STREP SCREEN  CULTURE, GROUP A STREP   Imaging Review No results found.  EKG Interpretation   None       MDM   1. Strep throat   2. Asthma exacerbation     3:28 PM Rapid strep negative. Patient will be treated based on clinical appearance. Patient will have albuterol nebulizer treatmend, atrovent and steroids. Vitals stable and patient afebrile. Patient will be discharged with Prednisone taper.    Emilia Beck, PA-C 12/17/12 1539

## 2012-12-17 NOTE — ED Provider Notes (Signed)
Medical screening examination/treatment/procedure(s) were performed by non-physician practitioner and as supervising physician I was immediately available for consultation/collaboration.  EKG Interpretation   None         Rolan Bucco, MD 12/17/12 [phone]

## 2012-12-19 LAB — CULTURE, GROUP A STREP

## 2012-12-21 ENCOUNTER — Encounter (HOSPITAL_COMMUNITY): Payer: Self-pay | Admitting: Emergency Medicine

## 2012-12-21 ENCOUNTER — Emergency Department (HOSPITAL_COMMUNITY): Payer: Self-pay

## 2012-12-21 ENCOUNTER — Inpatient Hospital Stay (HOSPITAL_COMMUNITY)
Admission: EM | Admit: 2012-12-21 | Discharge: 2012-12-25 | DRG: 202 | Disposition: A | Discharge status: Home / Self Care | Payer: Self-pay | Attending: Family Medicine | Admitting: Family Medicine

## 2012-12-21 DIAGNOSIS — J9600 Acute respiratory failure, unspecified whether with hypoxia or hypercapnia: Secondary | ICD-10-CM | POA: Diagnosis present

## 2012-12-21 DIAGNOSIS — I498 Other specified cardiac arrhythmias: Secondary | ICD-10-CM | POA: Diagnosis present

## 2012-12-21 DIAGNOSIS — E876 Hypokalemia: Secondary | ICD-10-CM | POA: Diagnosis present

## 2012-12-21 DIAGNOSIS — Z79899 Other long term (current) drug therapy: Secondary | ICD-10-CM

## 2012-12-21 DIAGNOSIS — R000 Tachycardia, unspecified: Secondary | ICD-10-CM | POA: Diagnosis present

## 2012-12-21 DIAGNOSIS — J45901 Unspecified asthma with (acute) exacerbation: Principal | ICD-10-CM | POA: Diagnosis present

## 2012-12-21 DIAGNOSIS — Z91199 Patient's noncompliance with other medical treatment and regimen due to unspecified reason: Secondary | ICD-10-CM

## 2012-12-21 DIAGNOSIS — Z9119 Patient's noncompliance with other medical treatment and regimen: Secondary | ICD-10-CM

## 2012-12-21 DIAGNOSIS — D72829 Elevated white blood cell count, unspecified: Secondary | ICD-10-CM | POA: Diagnosis present

## 2012-12-21 DIAGNOSIS — J209 Acute bronchitis, unspecified: Secondary | ICD-10-CM | POA: Diagnosis present

## 2012-12-21 DIAGNOSIS — Z825 Family history of asthma and other chronic lower respiratory diseases: Secondary | ICD-10-CM

## 2012-12-21 DIAGNOSIS — T380X5A Adverse effect of glucocorticoids and synthetic analogues, initial encounter: Secondary | ICD-10-CM | POA: Diagnosis present

## 2012-12-21 DIAGNOSIS — IMO0002 Reserved for concepts with insufficient information to code with codable children: Secondary | ICD-10-CM

## 2012-12-21 LAB — CBC WITH DIFFERENTIAL/PLATELET
Basophils Relative: 0 % (ref 0–1)
Eosinophils Absolute: 0.2 10*3/uL (ref 0.0–0.7)
Eosinophils Relative: 1 % (ref 0–5)
Hemoglobin: 14.1 g/dL (ref 12.0–15.0)
Lymphocytes Relative: 13 % (ref 12–46)
Lymphs Abs: 2.5 10*3/uL (ref 0.7–4.0)
MCH: 28.4 pg (ref 26.0–34.0)
MCHC: 36 g/dL (ref 30.0–36.0)
MCV: 78.9 fL (ref 78.0–100.0)
Monocytes Absolute: 1 10*3/uL (ref 0.1–1.0)
Neutro Abs: 15.7 10*3/uL — ABNORMAL HIGH (ref 1.7–7.7)
Neutrophils Relative %: 81 % — ABNORMAL HIGH (ref 43–77)
RBC: 4.97 MIL/uL (ref 3.87–5.11)

## 2012-12-21 LAB — BASIC METABOLIC PANEL
BUN: 6 mg/dL (ref 6–23)
CO2: 23 mEq/L (ref 19–32)
Calcium: 9.4 mg/dL (ref 8.4–10.5)
Creatinine, Ser: 0.83 mg/dL (ref 0.50–1.10)
GFR calc non Af Amer: >90 mL/min (ref >90)
Glucose, Bld: 105 mg/dL — ABNORMAL HIGH (ref 70–99)
Sodium: 140 mEq/L (ref 135–145)

## 2012-12-21 MED ORDER — LEVALBUTEROL HCL 0.63 MG/3ML IN NEBU
0.6300 mg | INHALATION_SOLUTION | Freq: Once | RESPIRATORY_TRACT | Status: AC
  Administered 2012-12-21: 0.63 mg via RESPIRATORY_TRACT
  Filled 2012-12-21: qty 3

## 2012-12-21 MED ORDER — LEVALBUTEROL HCL 0.63 MG/3ML IN NEBU
0.6300 mg | INHALATION_SOLUTION | RESPIRATORY_TRACT | Status: DC | PRN
  Administered 2012-12-21 – 2012-12-22 (×2): 0.63 mg via RESPIRATORY_TRACT
  Filled 2012-12-21 (×2): qty 3

## 2012-12-21 MED ORDER — SODIUM CHLORIDE 0.9 % IJ SOLN
3.0000 mL | Freq: Two times a day (BID) | INTRAMUSCULAR | Status: DC
  Administered 2012-12-21 – 2012-12-25 (×7): 3 mL via INTRAVENOUS

## 2012-12-21 MED ORDER — ALBUTEROL SULFATE (5 MG/ML) 0.5% IN NEBU
5.0000 mg | INHALATION_SOLUTION | RESPIRATORY_TRACT | Status: DC
  Administered 2012-12-21: 5 mg via RESPIRATORY_TRACT
  Filled 2012-12-21: qty 1

## 2012-12-21 MED ORDER — ONDANSETRON HCL 4 MG PO TABS: 4.0000 mg | ORAL_TABLET | Freq: Four times a day (QID) | ORAL | Status: DC | PRN

## 2012-12-21 MED ORDER — ONDANSETRON HCL 4 MG/2ML IJ SOLN: 4.0000 mg | Freq: Four times a day (QID) | INTRAMUSCULAR | Status: DC | PRN

## 2012-12-21 MED ORDER — POTASSIUM CHLORIDE IN NACL 20-0.9 MEQ/L-% IV SOLN
INTRAVENOUS | Status: AC
  Administered 2012-12-21: 22:00:00 via INTRAVENOUS
  Filled 2012-12-21: qty 1000

## 2012-12-21 MED ORDER — GUAIFENESIN ER 600 MG PO TB12
600.0000 mg | ORAL_TABLET | Freq: Two times a day (BID) | ORAL | Status: DC
  Administered 2012-12-22 (×2): 600 mg via ORAL
  Filled 2012-12-21 (×9): qty 1

## 2012-12-21 MED ORDER — ACETAMINOPHEN 650 MG RE SUPP: 650.0000 mg | Freq: Four times a day (QID) | RECTAL | Status: DC | PRN

## 2012-12-21 MED ORDER — IPRATROPIUM BROMIDE 0.02 % IN SOLN
0.5000 mg | Freq: Four times a day (QID) | RESPIRATORY_TRACT | Status: DC
  Administered 2012-12-22: 03:00:00 0.5 mg via RESPIRATORY_TRACT
  Filled 2012-12-21: qty 2.5

## 2012-12-21 MED ORDER — POTASSIUM CHLORIDE CRYS ER 20 MEQ PO TBCR
40.0000 meq | EXTENDED_RELEASE_TABLET | Freq: Once | ORAL | Status: AC
  Administered 2012-12-21: 40 meq via ORAL
  Filled 2012-12-21: qty 2

## 2012-12-21 MED ORDER — LEVALBUTEROL HCL 0.63 MG/3ML IN NEBU
0.6300 mg | INHALATION_SOLUTION | Freq: Four times a day (QID) | RESPIRATORY_TRACT | Status: DC
  Administered 2012-12-22: 0.63 mg via RESPIRATORY_TRACT
  Filled 2012-12-21 (×5): qty 3

## 2012-12-21 MED ORDER — PREDNISONE 20 MG PO TABS
60.0000 mg | ORAL_TABLET | Freq: Once | ORAL | Status: AC
  Administered 2012-12-21: 60 mg via ORAL
  Filled 2012-12-21: qty 3

## 2012-12-21 MED ORDER — AZITHROMYCIN 250 MG PO TABS
250.0000 mg | ORAL_TABLET | Freq: Every day | ORAL | Status: DC
  Administered 2012-12-22 – 2012-12-24 (×3): 250 mg via ORAL
  Filled 2012-12-21 (×3): qty 1

## 2012-12-21 MED ORDER — GUAIFENESIN-DM 100-10 MG/5ML PO SYRP: 10.0000 mL | ORAL_SOLUTION | ORAL | Status: DC | PRN

## 2012-12-21 MED ORDER — ENOXAPARIN SODIUM 40 MG/0.4ML ~~LOC~~ SOLN
40.0000 mg | SUBCUTANEOUS | Status: DC
  Administered 2012-12-21 – 2012-12-24 (×4): 40 mg via SUBCUTANEOUS
  Filled 2012-12-21 (×5): qty 0.4

## 2012-12-21 MED ORDER — METHYLPREDNISOLONE SODIUM SUCC 125 MG IJ SOLR
60.0000 mg | Freq: Three times a day (TID) | INTRAMUSCULAR | Status: DC
  Administered 2012-12-21 – 2012-12-22 (×2): 60 mg via INTRAVENOUS
  Filled 2012-12-21 (×5): qty 0.96

## 2012-12-21 MED ORDER — ALBUTEROL (5 MG/ML) CONTINUOUS INHALATION SOLN
15.0000 mg/h | INHALATION_SOLUTION | Freq: Once | RESPIRATORY_TRACT | Status: AC
  Administered 2012-12-21: 15 mg/h via RESPIRATORY_TRACT
  Filled 2012-12-21: qty 20

## 2012-12-21 MED ORDER — ACETAMINOPHEN 325 MG PO TABS
650.0000 mg | ORAL_TABLET | Freq: Four times a day (QID) | ORAL | Status: DC | PRN
  Administered 2012-12-22 – 2012-12-25 (×5): 650 mg via ORAL
  Filled 2012-12-21 (×5): qty 2

## 2012-12-21 MED ORDER — IPRATROPIUM BROMIDE 0.02 % IN SOLN
1.0000 mg | Freq: Once | RESPIRATORY_TRACT | Status: AC
  Administered 2012-12-21: 1 mg via RESPIRATORY_TRACT
  Filled 2012-12-21: qty 5

## 2012-12-21 MED ORDER — ONDANSETRON HCL 4 MG/2ML IJ SOLN: 4.0000 mg | INTRAMUSCULAR | Status: DC | PRN

## 2012-12-21 MED ORDER — AZITHROMYCIN 500 MG PO TABS
500.0000 mg | ORAL_TABLET | Freq: Every day | ORAL | Status: AC
  Administered 2012-12-21: 23:00:00 500 mg via ORAL
  Filled 2012-12-21 (×2): qty 1

## 2012-12-21 NOTE — ED Notes (Signed)
RT informed for Albuterol treatment. Hospitalist has seem this patient

## 2012-12-21 NOTE — ED Provider Notes (Signed)
CSN: [id number]     Arrival date & time 12/21/12  1155 History   First MD Initiated Contact with Patient 12/21/12 1211     Chief Complaint  Patient presents with  . Cough    HPI Pt was seen at 1225.  Per pt, c/o gradual onset and worsening of persistent cough, wheezing and SOB for the past 4 days.  Describes her symptoms as "my asthma is acting up."  Has been using home MDI with transient relief. Has been associated with runny/stuffy nose and sinus congestion. Pt was evaluated in the ED 2 days ago for same, rx prednisone. States she continues to have cough, SOB and wheezing despite using her MDI and taking her prednisone as instructed. Does state her sore throat from her previous ED visit has improved. Denies CP/palpitations, no back pain, no abd pain, no N/V/D, no fevers, no rash.     Past Medical History  Diagnosis Date  . Asthma    No past surgical history on file.  Family History  Problem Relation Age of Onset  . Asthma Father   . Autism Brother    History  Substance Use Topics  . Smoking status: Never Smoker   . Smokeless tobacco: Never Used  . Alcohol Use: No    Review of Systems ROS: Statement: All systems negative except as marked or noted in the HPI; Constitutional: Negative for fever and chills. ; ; Eyes: Negative for eye pain, redness and discharge. ; ; ENMT: Negative for ear pain, hoarseness, sore throat. +nasal congestion, rhinorrhea, sinus pressure. ; ; Cardiovascular: Negative for chest pain, palpitations, diaphoresis, and peripheral edema. ; ; Respiratory: +cough, wheezing, SOB. Negative for stridor. ; ; Gastrointestinal: Negative for nausea, vomiting, diarrhea, abdominal pain, blood in stool, hematemesis, jaundice and rectal bleeding. . ; ; Genitourinary: Negative for dysuria, flank pain and hematuria. ; ; Musculoskeletal: Negative for back pain and neck pain. Negative for swelling and trauma.; ; Skin: Negative for pruritus, rash, abrasions, blisters, bruising and  skin lesion.; ; Neuro: Negative for headache, lightheadedness and neck stiffness. Negative for weakness, altered level of consciousness , altered mental status, extremity weakness, paresthesias, involuntary movement, seizure and syncope.      Allergies  Peanut-containing drug products and Coconut flavor  Home Medications   Current Outpatient Rx  Name  Route  Sig  Dispense  Refill  . albuterol (PROVENTIL HFA;VENTOLIN HFA) 108 (90 BASE) MCG/ACT inhaler   Inhalation   Inhale 1-2 puffs into the lungs every 6 (six) hours as needed for wheezing.   2 Inhaler   1   . dextromethorphan (DELSYM) 30 MG/5ML liquid   Oral   Take 2.5 mLs (15 mg total) by mouth as needed for cough.   89 mL   0   . guaiFENesin (ROBITUSSIN) 100 MG/5ML liquid   Oral   Take 200 mg by mouth 3 (three) times daily as needed for cough.         . predniSONE (DELTASONE) 20 MG tablet   Oral   Take 2 tablets (40 mg total) by mouth daily. Take 40 mg by mouth daily for 3 days, then 20mg  by mouth daily for 3 days, then 10mg  daily for 3 days   12 tablet   0   . Pseudoeph-Doxylamine-DM-APAP (NYQUIL PO)   Oral   Take 30 mLs by mouth at bedtime as needed (for cold symptoms).          BP 117/74  Pulse 97  Temp(Src) 98.4 F (36.9 C) (  Oral)  Resp 22  Ht 5' (1.524 m)  Wt 135 lb (61.236 kg)  BMI 26.37 kg/m2  SpO2 97%  LMP 09/11/2012 Physical Exam 1230: Physical examination:  Nursing notes reviewed; Vital signs and O2 SAT reviewed;  Constitutional: Well developed, Well nourished, Well hydrated, Uncomfortable appearing; Head:  Normocephalic, atraumatic; Eyes: EOMI, PERRL, No scleral icterus; ENMT: TM's clear bilat. +edemetous nasal turbinates bilat with clear rhinorrhea. +mild posterior pharyngeal erythema. Mouth and pharynx without lesions. No tonsillar exudates. No intra-oral edema. No submandibular or sublingual edema. No hoarse voice, no drooling, no stridor. No pain with manipulation of larynx. Mucous membranes  moist; Neck: Supple, Full range of motion, No lymphadenopathy; Cardiovascular: Regular rate and rhythm, No gallop; Respiratory: Breath sounds coarse & equal bilaterally, insp/exp wheezes bilat, occasional audible wheezing. Speaking in phrases. Tachypneic. Sitting upright; Chest: Nontender, Movement normal; Abdomen: Soft, Nontender, Nondistended, Normal bowel sounds; Genitourinary: No CVA tenderness; Extremities: Pulses normal, No tenderness, No edema, No calf edema or asymmetry.; Neuro: AA&Ox3, Major CN grossly intact.  Speech clear. No gross focal motor or sensory deficits in extremities.; Skin: Color normal, Warm, Dry.   ED Course  Procedures    EKG Interpretation   None       MDM  MDM Reviewed: previous chart, nursing note and vitals Reviewed previous: labs and x-ray Interpretation: labs and x-ray Total time providing critical care: 30-74 minutes. This excludes time spent performing separately reportable procedures and services.   CRITICAL CARE Performed by: Laray Anger Total critical care time: 35 Critical care time was exclusive of separately billable procedures and treating other patients. Critical care was necessary to treat or prevent imminent or life-threatening deterioration. Critical care was time spent personally by me on the following activities: development of treatment plan with patient and/or surrogate as well as nursing, discussions with consultants, evaluation of patient's response to treatment, examination of patient, obtaining history from patient or surrogate, ordering and performing treatments and interventions, ordering and review of laboratory studies, ordering and review of radiographic studies, pulse oximetry and re-evaluation of patient's condition.     Results for orders placed during the hospital encounter of 12/17/12  RAPID STREP SCREEN      Result Value Range   Streptococcus, Group A Screen (Direct) NEGATIVE  NEGATIVE  CULTURE, GROUP A STREP       Result Value Range   Specimen Description THROAT     Special Requests NONE     Culture       Value: No Beta Hemolytic Streptococci Isolated     Performed at [facility]   Report Status 12/19/2012 FINAL     Dg Chest 2 View 12/21/2012   CLINICAL DATA:  Cough and dyspnea  EXAM: CHEST  2 VIEW  COMPARISON:  November 20, 2012  FINDINGS: There is chronic central peribronchial thickening consistent with bronchitis. There is no edema or consolidation. Heart size and pulmonary vascularity are normal. No adenopathy. No bone lesions.  IMPRESSION: Evidence of chronic bronchitis centrally. No edema or consolidation.   Electronically Signed   By: Bretta Bang M.D.   On: 12/21/2012 13:48     1235:  On arrival, pt with insp/exp wheezing, coughing, tachypneic, speaking in phrases. Will start hour long neb and dose steroid.   1610:  Hour long neb completed:  Tachypneic, lungs continue coarse with scattered wheezes, Sats 95% R/A. Pt ambulated with c/o increasing RR, SOB, coughing and WOB. Escorted back to stretcher with ED staff. Will dose xopenex neb, obtain labs; needs  admit.  Sign out to Dr. Karma Ganja.      Laray Anger, DO 12/21/12 [phone]

## 2012-12-21 NOTE — Progress Notes (Signed)
Texted MD, Hongalgi, that EKG has been done. ST HR 112 with nonspecific T wave abnormality.

## 2012-12-21 NOTE — ED Notes (Signed)
Report given to Rn on [facility]

## 2012-12-21 NOTE — H&P (Addendum)
[facility]  History and Physical  [name] XBJ:[id number] DOB: 03/15/1990 DOA: 12/21/2012  Referring physician: EDP PCP: No PCP Per Patient  Outpatient Specialists:  1. None  Chief Complaint: Productive cough, dyspnea and wheezing  HPI: Nicole White is a 22 y.o. female with history of childhood asthma, seems to be poorly controlled and patient has not sought adequate medical attention due to lack of insurance and financial constraints, presents to the ED for the second time in the last 4 days with complaints of cough and dyspnea. Patient gives a one-week history of sore throat, cough productive of yellow-brown sputum, no fever or chills, chest pain only on coughing, worsening dyspnea, wheezing, chest tightness and an episode of nonbloody emesis. She was seen in the ED on 12/7 and treated for asthma exacerbation with nebulizations, steroids and discharged home on prednisone taper and rescue inhaler. Rapid strep testing was negative. Patient states that she felt better initially but subsequently continued to have same complaints. She was unable to eat secondary to persistent cough which was not relieved with the cough medications that she had. Her sore throat seems to have improved. In the ED, she was treated again for asthma exacerbation with prolonged nebulizations, a dose of oral prednisone after which she feels better but still is wheezing in the hospitalists have been requested to admit for further evaluation and management. Patient was apparently tachycardic in the 140s which improved after switching her nebulizations to Xopenex. Patient states that she was able to eat for the first time after a long time and her breathing and wheezing has improved. She states that she has very frequent asthma flares which he treats with her rescue inhaler.   Review of Systems: All systems reviewed and apart from history of presenting illness, are negative.  Past Medical History  Diagnosis Date  .  Asthma    Past Surgical History  Procedure Laterality Date  . No past surgeries     Social History:  reports that she has never smoked. She has never used smokeless tobacco. She reports that she does not drink alcohol or use illicit drugs. Lives with boyfriend. Independent of activities of daily living.  Allergies  Allergen Reactions  . Peanut-Containing Drug Products Anaphylaxis  . Coconut Flavor Swelling    Family History  Problem Relation Age of Onset  . Asthma Father   . Autism Brother     Prior to Admission medications   Medication Sig Start Date End Date Taking? Authorizing Provider  albuterol (PROVENTIL HFA;VENTOLIN HFA) 108 (90 BASE) MCG/ACT inhaler Inhale 1-2 puffs into the lungs every 6 (six) hours as needed for wheezing. 11/20/12  Yes Dagmar Hait, MD  dextromethorphan (DELSYM) 30 MG/5ML liquid Take 2.5 mLs (15 mg total) by mouth as needed for cough. 12/17/12  Yes Kaitlyn Szekalski, PA-C  guaiFENesin (ROBITUSSIN) 100 MG/5ML liquid Take 200 mg by mouth 3 (three) times daily as needed for cough.   Yes Historical Provider, MD  predniSONE (DELTASONE) 20 MG tablet Take 2 tablets (40 mg total) by mouth daily. Take 40 mg by mouth daily for 3 days, then 20mg  by mouth daily for 3 days, then 10mg  daily for 3 days 12/17/12  Yes Kaitlyn Szekalski, PA-C  Pseudoeph-Doxylamine-DM-APAP (NYQUIL PO) Take 30 mLs by mouth at bedtime as needed (for cold symptoms).   Yes Historical Provider, MD   Physical Exam: Filed Vitals:   12/21/12 1508 12/21/12 1632 12/21/12 1934 12/21/12 1938  BP:  116/74 106/65 123/76  Pulse: 135 139 114  116  Temp:  97.7 F (36.5 C)  98.1 F (36.7 C)  TempSrc:  Oral  Oral  Resp:  16 15 22   Height:      Weight:      SpO2: 95% 100% 97% 96%   The patient was examined with a female chaperone in the room.  General exam: Moderately built and nourished pleasant young female patient, lying comfortably supine on the gurney in no obvious distress.  Head, eyes  and ENT: Nontraumatic and normocephalic. Pupils equally reacting to light and accommodation. Oral mucosa mildly dry. Throat mildly congested but no drainage and tonsils are also mildly enlarged.  Neck: Supple. No JVD, carotid bruit or thyromegaly.  Lymphatics: No lymphadenopathy.  Respiratory system: Slightly reduced breath sounds bilaterally with bilateral few medium pitched expiratory rhonchi but no crackles. Able to speak in full sentences. No increased work of breathing.  Cardiovascular system: S1 and S2 heard, mild regular tachycardia. No JVD, murmurs, gallops, clicks or pedal edema. Telemetry: Sinus tachycardia in the 100s.  Gastrointestinal system: Abdomen is nondistended, soft and nontender. Normal bowel sounds heard. No organomegaly or masses appreciated.  Central nervous system: Alert and oriented. No focal neurological deficits.  Extremities: Symmetric 5 x 5 power. Peripheral pulses symmetrically felt.   Skin: No rashes or acute findings.  Musculoskeletal system: Negative exam.  Psychiatry: Pleasant and cooperative.   Labs on Admission:  Basic Metabolic Panel:  Recent Labs Lab 12/21/12 1610  NA 140  K 3.0*  CL 105  CO2 23  GLUCOSE 105*  BUN 6  CREATININE 0.83  CALCIUM 9.4   Liver Function Tests: No results found for this basename: AST, ALT, ALKPHOS, BILITOT, PROT, ALBUMIN,  in the last 168 hours No results found for this basename: LIPASE, AMYLASE,  in the last 168 hours No results found for this basename: AMMONIA,  in the last 168 hours CBC:  Recent Labs Lab 12/21/12 1610  WBC 19.4*  NEUTROABS 15.7*  HGB 14.1  HCT 39.2  MCV 78.9  PLT 245   Cardiac Enzymes: No results found for this basename: CKTOTAL, CKMB, CKMBINDEX, TROPONINI,  in the last 168 hours  BNP (last 3 results) No results found for this basename: PROBNP,  in the last 8760 hours CBG: No results found for this basename: GLUCAP,  in the last 168 hours  Radiological Exams on  Admission: Dg Chest 2 View  12/21/2012   CLINICAL DATA:  Cough and dyspnea  EXAM: CHEST  2 VIEW  COMPARISON:  November 20, 2012  FINDINGS: There is chronic central peribronchial thickening consistent with bronchitis. There is no edema or consolidation. Heart size and pulmonary vascularity are normal. No adenopathy. No bone lesions.  IMPRESSION: Evidence of chronic bronchitis centrally. No edema or consolidation.   Electronically Signed   By: Bretta Bang M.D.   On: 12/21/2012 13:48    EKG: Addendum: EKG on night of admission was reviewed same night: Mild sinus tachycardia without any acute abnormalities.  Assessment/Plan Principal Problem:   Asthma exacerbation Active Problems:   Acute bronchitis with bronchospasm   Hypokalemia   Leukocytosis   Sinus tachycardia   Asthma exacerbation - Likely precipitated by acute bronchitis. This may have been viral bronchitis initially but now possibly superadded bacterial infection. - Recent rapid strep throat negative. - Admit to telemetry for close observation and management he - Treat with oxygen, bronchodilator nebulizations, oral azithromycin, IV Solu-Medrol and Mucinex. - Patient is improving. - She will need assistance with PCP and medications at discharge:  Case management consulted.  Hypokalemia - Orally repleted. Follow BMP in a.m.  Leukocytosis - Likely secondary to steroids and acute bronchitis. - Follow CBC in a.m.  Sinus tachycardia - Secondary to asthma exacerbation and breathing treatments. - Improving. Monitor on telemetry.     Code Status: Full   Family Communication: None at bedside   Disposition Plan: Home when medically stable    Time spent: 60 minutes   Ollen Rao, MD, FACP, FHM. [facility] Pager [phone]  If 7PM-7AM, please contact night-coverage [email or web address] Password [facility] 12/21/2012, 8:42 PM

## 2012-12-21 NOTE — ED Notes (Signed)
Per EMS: [facility]/o productive cough. Pt was seen here two days ago for similar symptoms. Per EMS, pt has rhonchi in all lobes. Pt is not a smoker. Pt reports no relief from treatment two days ago. Pt is A&Ox4, respirations equal and unlabored, skin warm and dry.

## 2012-12-21 NOTE — Progress Notes (Signed)
[facility] provided pt with a list of primary care resources and information on [facility].  °

## 2012-12-22 LAB — CBC
HCT: 38.9 % (ref 36.0–46.0)
Hemoglobin: 13.5 g/dL (ref 12.0–15.0)
MCH: 27.5 pg (ref 26.0–34.0)
MCHC: 34.7 g/dL (ref 30.0–36.0)
RDW: 13 % (ref 11.5–15.5)

## 2012-12-22 LAB — BASIC METABOLIC PANEL
BUN: 9 mg/dL (ref 6–23)
Chloride: 107 mEq/L (ref 96–112)
Creatinine, Ser: 0.82 mg/dL (ref 0.50–1.10)
GFR calc non Af Amer: >90 mL/min (ref >90)
Glucose, Bld: 162 mg/dL — ABNORMAL HIGH (ref 70–99)
Potassium: 4.4 mEq/L (ref 3.5–5.1)

## 2012-12-22 MED ORDER — IPRATROPIUM BROMIDE 0.02 % IN SOLN
0.5000 mg | Freq: Four times a day (QID) | RESPIRATORY_TRACT | Status: DC
  Administered 2012-12-22 – 2012-12-25 (×15): 0.5 mg via RESPIRATORY_TRACT
  Filled 2012-12-22 (×15): qty 2.5

## 2012-12-22 MED ORDER — LEVALBUTEROL HCL 0.63 MG/3ML IN NEBU
0.6300 mg | INHALATION_SOLUTION | Freq: Four times a day (QID) | RESPIRATORY_TRACT | Status: DC | PRN
  Administered 2012-12-22 (×2): 0.63 mg via RESPIRATORY_TRACT
  Filled 2012-12-22: qty 3

## 2012-12-22 MED ORDER — PREDNISONE 50 MG PO TABS
60.0000 mg | ORAL_TABLET | Freq: Every day | ORAL | Status: DC
  Administered 2012-12-22: 15:00:00 60 mg via ORAL
  Filled 2012-12-22 (×2): qty 1

## 2012-12-22 MED ORDER — LEVALBUTEROL HCL 0.63 MG/3ML IN NEBU
0.6300 mg | INHALATION_SOLUTION | RESPIRATORY_TRACT | Status: AC
  Administered 2012-12-23 (×5): 0.63 mg via RESPIRATORY_TRACT
  Filled 2012-12-22 (×5): qty 3

## 2012-12-22 MED ORDER — METHYLPREDNISOLONE SODIUM SUCC 125 MG IJ SOLR
60.0000 mg | Freq: Once | INTRAMUSCULAR | Status: AC
  Administered 2012-12-22: 60 mg via INTRAVENOUS
  Filled 2012-12-22: qty 0.96

## 2012-12-22 MED ORDER — OXYCODONE HCL 5 MG PO TABS
5.0000 mg | ORAL_TABLET | ORAL | Status: DC | PRN
  Administered 2012-12-22 – 2012-12-25 (×4): 5 mg via ORAL
  Filled 2012-12-22 (×4): qty 1

## 2012-12-22 MED ORDER — LEVALBUTEROL HCL 0.63 MG/3ML IN NEBU
0.6300 mg | INHALATION_SOLUTION | RESPIRATORY_TRACT | Status: DC
  Administered 2012-12-22 (×2): 0.63 mg via RESPIRATORY_TRACT
  Filled 2012-12-22 (×2): qty 3

## 2012-12-22 MED ORDER — LEVALBUTEROL HCL 0.63 MG/3ML IN NEBU: 0.6300 mg | INHALATION_SOLUTION | RESPIRATORY_TRACT | Status: DC

## 2012-12-22 NOTE — Progress Notes (Addendum)
Pt setup with the [facility] program, Teach Back given. Information given to pt to call for follow up appointment with [facility].

## 2012-12-22 NOTE — Progress Notes (Signed)
Follow up appointment 01/23/13 at 1200, at [facility]. Information given to pt and encouraged her to keep appointment.

## 2012-12-22 NOTE — Progress Notes (Signed)
Nicole White WUJ:[id number] DOB: 03-12-1990 DOA: 12/21/2012 PCP: No PCP Per Patient  Brief narrative: 22 year old ?, known childhood asthma, allergies, never intubated, noncompliant with medications secondary to cost, no PCP admitted to hospital 12/21/12-recently seen 12/7 given rescue inhaler and prednisone taper.  Noted sustained tachycardia during admission. Has been seen in the emergency room 7 times in 2014   Past medical history-As per Problem list Chart reviewed as below- Reviewed  Consultants:   none  Procedures:  Chest x-ray  Antibiotics:  Z-Pak 12/11   Subjective  Feels much better. Tolerating diet. Doesn't that she cannot afford her medications and that she will need assistance in getting them. Unemployed currently Lives with boyfriend   Objective    Interim History: None  Telemetry: Sinus tach 1:30 to 140  Objective: Filed Vitals:   12/22/12 0556 12/22/12 0843 12/22/12 1100 12/22/12 1341  BP: 120/70   121/64  Pulse: 98   118  Temp: 98.9 F (37.2 C)   98.7 F (37.1 C)  TempSrc: Oral   Oral  Resp: 16   20  Height:      Weight:      SpO2: 99% 93% 94% 93%    Intake/Output Summary (Last 24 hours) at 12/22/12 1514 Last data filed at 12/22/12 1300  Gross per 24 hour  Intake 1312.5 ml  Output      0 ml  Net 1312.5 ml    Exam:  General: EOMI, NCAT, no acute respiratory distress Body mass index is 25.96 kg/(m^2). Cardiovascular: S1-S2 no murmur rub or gallop , tachycardic Respiratory: Clinically clear no added sound no wheeze Abdomen: Soft nontender nondistended no rebound Skin no lower extremity edema Neuro intact  Data Reviewed: Basic Metabolic Panel:  Recent Labs Lab 12/21/12 1610 12/22/12 0445  NA 140 139  K 3.0* 4.4  CL 105 107  CO2 23 21  GLUCOSE 105* 162*  BUN 6 9  CREATININE 0.83 0.82  CALCIUM 9.4 9.5   Liver Function Tests: No results found for this basename: AST, ALT, ALKPHOS, BILITOT, PROT, ALBUMIN,  in the last 168  hours No results found for this basename: LIPASE, AMYLASE,  in the last 168 hours No results found for this basename: AMMONIA,  in the last 168 hours CBC:  Recent Labs Lab 12/21/12 1610 12/22/12 0445  WBC 19.4* 23.1*  NEUTROABS 15.7*  --   HGB 14.1 13.5  HCT 39.2 38.9  MCV 78.9 79.2  PLT 245 268   Cardiac Enzymes: No results found for this basename: CKTOTAL, CKMB, CKMBINDEX, TROPONINI,  in the last 168 hours BNP: No components found with this basename: POCBNP,  CBG: No results found for this basename: GLUCAP,  in the last 168 hours  Recent Results (from the past 240 hour(s))  RAPID STREP SCREEN     Status: None   Collection Time    12/17/12  2:31 PM      Result Value Range Status   Streptococcus, Group A Screen (Direct) NEGATIVE  NEGATIVE Final   Comment: (NOTE)     A Rapid Antigen test may result negative if the antigen level in the     sample is below the detection level of this test. The FDA has not     cleared this test as a stand-alone test therefore the rapid antigen     negative result has reflexed to a Group A Strep culture.  CULTURE, GROUP A STREP     Status: None   Collection Time    12/17/12  2:31 PM      Result Value Range Status   Specimen Description THROAT   Final   Special Requests NONE   Final   Culture     Final   Value: No Beta Hemolytic Streptococci Isolated     Performed at [facility]   Report Status 12/19/2012 FINAL   Final     Studies:              All Imaging reviewed and is as per above notation   Scheduled Meds: . azithromycin  250 mg Oral Daily  . enoxaparin (LOVENOX) injection  40 mg Subcutaneous Q24H  . guaiFENesin  600 mg Oral BID  . ipratropium  0.5 mg Nebulization QID  . predniSONE  60 mg Oral Daily  . sodium chloride  3 mL Intravenous Q12H   Continuous Infusions:    Assessment/Plan: 1. Acute asthma exacerbation with superimposed bronchitis-continue Xopenex every 6 when necessary, Z-Pak, have spoken to case  management to help coordinate care. I suggested patient followed [facility] for further care 2. Sinus tachycardia likely reactive secondary to steroids.  Continue monitoring on telemetry 3. Noncompliance to medical therapy secondary to lack of funds 4. Unemployed state  Code Status: Full Family Communication: None at bedside Disposition Plan: The patient   Pleas Koch, MD  [facility] Pager [phone] 12/22/2012, 3:14 PM    LOS: 1 day

## 2012-12-22 NOTE — Progress Notes (Signed)
Called to patient bedside by RN for SOB complaints. Patient in NAD, but does have diffuse wheezes with tachypnea and tachycardia. Noted that steroid orders have changed from TID intravenously to daily oral dosages. HHN have been administered at least Q4 hours since admission, but does not appear to give the patient relief for very long. Spoke with RN. NP paged. In conversations with Elray Mcgregor, NP with [facility] and Debbe Bales, RN with rapid response, RT recommended 10mg  albuterol CAT versus SDU with Q2 xopenex as well as IV steroids. Awaiting further orders.

## 2012-12-22 NOTE — Progress Notes (Signed)
Subjective/Objective Having worsening dyspnea. Oxygen saturation 98% on   Scheduled Meds: . azithromycin  250 mg Oral Daily  . enoxaparin (LOVENOX) injection  40 mg Subcutaneous Q24H  . guaiFENesin  600 mg Oral BID  . ipratropium  0.5 mg Nebulization QID  . levalbuterol  0.63 mg Nebulization Q2H  . predniSONE  60 mg Oral Daily  . sodium chloride  3 mL Intravenous Q12H   Continuous Infusions:  PRN Meds:acetaminophen, acetaminophen, guaiFENesin-dextromethorphan, levalbuterol, ondansetron (ZOFRAN) IV, ondansetron, oxyCODONE  Vital signs in last 24 hours: Temp:  [98.7 F (37.1 C)-98.9 F (37.2 C)] 98.8 F (37.1 C) (12/12 2150) Pulse Rate:  [98-118] 110 (12/12 2150) Resp:  [16-20] 18 (12/12 2150) BP: (119-121)/(64-70) 119/70 mmHg (12/12 2150) SpO2:  [93 %-100 %] 98 % (12/12 2150)  Intake/Output last 3 shifts: I/O last 3 completed shifts: In: 1312.5 [P.O.:600; I.V.:712.5] Out: -  Intake/Output this shift:   Physical Examination: General appearance - mild distress. Alert, oriented Chest - wheezing, tachypeic Heart - tachycardic.   Problem Assessment/Plan  Asthma with continuous bronchospasm. Move to SDU for every 2 hours Xopenex. Gave OTO Methylprednisolone 60 mg IV.

## 2012-12-23 ENCOUNTER — Inpatient Hospital Stay (HOSPITAL_COMMUNITY): Payer: Self-pay

## 2012-12-23 DIAGNOSIS — J45909 Unspecified asthma, uncomplicated: Secondary | ICD-10-CM

## 2012-12-23 DIAGNOSIS — J45901 Unspecified asthma with (acute) exacerbation: Secondary | ICD-10-CM

## 2012-12-23 DIAGNOSIS — D72829 Elevated white blood cell count, unspecified: Secondary | ICD-10-CM

## 2012-12-23 DIAGNOSIS — J209 Acute bronchitis, unspecified: Secondary | ICD-10-CM

## 2012-12-23 LAB — CBC WITH DIFFERENTIAL/PLATELET
Basophils Absolute: 0 10*3/uL (ref 0.0–0.1)
Eosinophils Absolute: 0 10*3/uL (ref 0.0–0.7)
Lymphocytes Relative: 8 % — ABNORMAL LOW (ref 12–46)
MCH: 27.9 pg (ref 26.0–34.0)
MCHC: 34.9 g/dL (ref 30.0–36.0)
Monocytes Absolute: 2.1 10*3/uL — ABNORMAL HIGH (ref 0.1–1.0)
Neutrophils Relative %: 87 % — ABNORMAL HIGH (ref 43–77)
RDW: 13.2 % (ref 11.5–15.5)

## 2012-12-23 LAB — BLOOD GAS, ARTERIAL
Bicarbonate: 25.4 mEq/L — ABNORMAL HIGH (ref 20.0–24.0)
O2 Saturation: 92.9 %
Patient temperature: 98.6
TCO2: 22.5 mmol/L (ref 0–100)
pH, Arterial: 7.413 (ref 7.350–7.450)
pO2, Arterial: 65.4 mmHg — ABNORMAL LOW (ref 80.0–100.0)

## 2012-12-23 LAB — MRSA PCR SCREENING: MRSA by PCR: NEGATIVE

## 2012-12-23 LAB — INFLUENZA PANEL BY PCR (TYPE A & B): Influenza A By PCR: NEGATIVE

## 2012-12-23 MED ORDER — HYDROCOD POLST-CHLORPHEN POLST 10-8 MG/5ML PO LQCR: 5.0000 mL | Freq: Two times a day (BID) | ORAL | Status: DC | PRN

## 2012-12-23 MED ORDER — LEVALBUTEROL HCL 0.63 MG/3ML IN NEBU
0.6300 mg | INHALATION_SOLUTION | RESPIRATORY_TRACT | Status: DC | PRN
  Administered 2012-12-23 – 2012-12-25 (×13): 0.63 mg via RESPIRATORY_TRACT
  Filled 2012-12-23 (×13): qty 3

## 2012-12-23 MED ORDER — DEXTROSE 5 % IV SOLN
2.0000 g | INTRAVENOUS | Status: DC
  Administered 2012-12-23 – 2012-12-24 (×2): 2 g via INTRAVENOUS
  Filled 2012-12-23 (×2): qty 2

## 2012-12-23 MED ORDER — METHYLPREDNISOLONE SODIUM SUCC 125 MG IJ SOLR
60.0000 mg | Freq: Four times a day (QID) | INTRAMUSCULAR | Status: DC
  Administered 2012-12-23 – 2012-12-25 (×9): 60 mg via INTRAVENOUS
  Filled 2012-12-23 (×13): qty 0.96

## 2012-12-23 NOTE — Consult Note (Signed)
PULMONARY  / CRITICAL CARE MEDICINE  Name: Nicole White MRN: [id number] DOB: 1990/09/23    ADMISSION DATE:  12/21/2012 CONSULTATION DATE:  12/23/2012  REFERRING MD :  Mahala Menghini PRIMARY SERVICE: [facility]  CHIEF COMPLAINT:  Shortness of breath  BRIEF PATIENT DESCRIPTION: 22 y/o female with asthma admitted on 12/11 with dyspnea and cough due to a severe asthma flare.  PCCM consulted on 12/13 for worsening dyspnea.  SIGNIFICANT EVENTS / STUDIES:    LINES / TUBES:   CULTURES: 12/13 influenza rapid > 12/13 influenza pcr >  ANTIBIOTICS: 12/11 azithro >> 12/13 ceftriaxone >>  HISTORY OF PRESENT ILLNESS:  22 y/o female with asthma admitted on 12/11 with dyspnea and cough due to a severe asthma flare.  PCCM consulted on 12/13 for worsening dyspnea.  She noted one week of cough productive of grey sputum without fever or chills or chest pain.  She had a sick contact from her niece.  She did not have a flu shot this year.  After one week of increased proAir use she started to feel more dyspnea and no relief from her inhaler so she came to the hospital and was admitted to SDU by [facility] on 12/11.  Overnight/early AM on 12/13 she developed worsening dyspnea so PCCM was consulted.  Her mucus production has improved somewhat but she does not feel less shortness of breath.  She is still getting relief from xopenex.  She does not smoke.    PAST MEDICAL HISTORY :  Past Medical History  Diagnosis Date  . Asthma    Past Surgical History  Procedure Laterality Date  . No past surgeries     Prior to Admission medications   Medication Sig Start Date End Date Taking? Authorizing Provider  albuterol (PROVENTIL HFA;VENTOLIN HFA) 108 (90 BASE) MCG/ACT inhaler Inhale 1-2 puffs into the lungs every 6 (six) hours as needed for wheezing. 11/20/12  Yes Dagmar Hait, MD  dextromethorphan (DELSYM) 30 MG/5ML liquid Take 2.5 mLs (15 mg total) by mouth as needed for cough. 12/17/12  Yes Kaitlyn Szekalski, PA-C   guaiFENesin (ROBITUSSIN) 100 MG/5ML liquid Take 200 mg by mouth 3 (three) times daily as needed for cough.   Yes Historical Provider, MD  predniSONE (DELTASONE) 20 MG tablet Take 2 tablets (40 mg total) by mouth daily. Take 40 mg by mouth daily for 3 days, then 20mg  by mouth daily for 3 days, then 10mg  daily for 3 days 12/17/12  Yes Kaitlyn Szekalski, PA-C  Pseudoeph-Doxylamine-DM-APAP (NYQUIL PO) Take 30 mLs by mouth at bedtime as needed (for cold symptoms).   Yes Historical Provider, MD   Allergies  Allergen Reactions  . Peanut-Containing Drug Products Anaphylaxis  . Coconut Flavor Swelling    FAMILY HISTORY:  Family History  Problem Relation Age of Onset  . Asthma Father   . Autism Brother    SOCIAL HISTORY:  reports that she has never smoked. She has never used smokeless tobacco. She reports that she does not drink alcohol or use illicit drugs.  REVIEW OF SYSTEMS:   Gen: Denies fever, chills, weight change, fatigue, night sweats HEENT: Denies blurred vision, double vision, hearing loss, tinnitus, sinus congestion, rhinorrhea, sore throat, neck stiffness, dysphagia PULM: per HPI CV: Denies chest pain, edema, orthopnea, paroxysmal nocturnal dyspnea, palpitations GI: Denies abdominal pain, nausea, vomiting, diarrhea, hematochezia, melena, constipation, change in bowel habits GU: Denies dysuria, hematuria, polyuria, oliguria, urethral discharge Endocrine: Denies hot or cold intolerance, polyuria, polyphagia or appetite change Derm: Denies rash, dry skin, scaling or  peeling skin change Heme: Denies easy bruising, bleeding, bleeding gums Neuro: Denies headache, numbness, weakness, slurred speech, loss of memory or consciousness   SUBJECTIVE:   VITAL SIGNS: Temp:  [98.1 F (36.7 C)-98.8 F (37.1 C)] 98.1 F (36.7 C) (12/13 0400) Pulse Rate:  [69-118] 79 (12/13 0500) Resp:  [17-28] 19 (12/13 0500) BP: (113-130)/(56-77) 113/56 mmHg (12/13 0422) SpO2:  [93 %-100 %] 100 % (12/13  0605) Weight:  [62.6 kg (138 lb 0.1 oz)-62.8 kg (138 lb 7.2 oz)] 62.6 kg (138 lb 0.1 oz) (12/13 0400) HEMODYNAMICS:   VENTILATOR SETTINGS:   INTAKE / OUTPUT: Intake/Output     12/12 0701 - 12/13 0700 12/13 0701 - 12/14 0700   P.O. 600    I.V. (mL/kg)     Total Intake(mL/kg) 600 (9.6)    Net +600          Urine Occurrence 2 x      PHYSICAL EXAMINATION: General:  Sitting up in bed, speaking in full sentences Neuro:  A&Ox4, MAEW HEENT:  NCAT, PERRL, EOMi Cardiovascular:  Tachy, regular, no mgr Lungs:  Wheezing (insp/exp) bilaterally Abdomen:  BS+, soft, nontender Musculoskeletal:  Warm, no edema, normal bulk and tone Skin:  No rash  LABS:  CBC  Recent Labs Lab 12/21/12 1610 12/22/12 0445  WBC 19.4* 23.1*  HGB 14.1 13.5  HCT 39.2 38.9  PLT 245 268   Coag's No results found for this basename: APTT, INR,  in the last 168 hours BMET  Recent Labs Lab 12/21/12 1610 12/22/12 0445  NA 140 139  K 3.0* 4.4  CL 105 107  CO2 23 21  BUN 6 9  CREATININE 0.83 0.82  GLUCOSE 105* 162*   Electrolytes  Recent Labs Lab 12/21/12 1610 12/22/12 0445  CALCIUM 9.4 9.5   Sepsis Markers No results found for this basename: LATICACIDVEN, PROCALCITON, O2SATVEN,  in the last 168 hours ABG No results found for this basename: PHART, PCO2ART, PO2ART,  in the last 168 hours Liver Enzymes No results found for this basename: AST, ALT, ALKPHOS, BILITOT, ALBUMIN,  in the last 168 hours Cardiac Enzymes No results found for this basename: TROPONINI, PROBNP,  in the last 168 hours Glucose No results found for this basename: GLUCAP,  in the last 168 hours  Imaging Dg Chest 2 View  12/21/2012   CLINICAL DATA:  Cough and dyspnea  EXAM: CHEST  2 VIEW  COMPARISON:  November 20, 2012  FINDINGS: There is chronic central peribronchial thickening consistent with bronchitis. There is no edema or consolidation. Heart size and pulmonary vascularity are normal. No adenopathy. No bone lesions.   IMPRESSION: Evidence of chronic bronchitis centrally. No edema or consolidation.   Electronically Signed   By: Bretta Bang M.D.   On: 12/21/2012 13:48     12/13 portable CXR: lungs clear, no infiltrate  ASSESSMENT / PLAN:  PULMONARY A: AE asthma presumably from a viral illness; It is worrisome that she has increasing dyspnea; CXR without infiltrate.  ABG shows a normal pCO2 and pH which can be somewhat concerning in an asthmatic with increased work of breathing, but at this point she does not need mechanical ventilation.  Mild hypoxemia on ABG likely due to V/Q mismatch P:   -continue solumedrol as written -add ceftriaxone for possible bacterial cause -continue azithromycin -continue scheduled xopenex and ipratropium -O2 as needed -close monitoring > may need BIPAP if dyspnea worsens -would repeat ABG if no improvement or if BIPAP needed -will try to help get assistance  for steroid inhalers prior to discharge  INFECTIOUS A:  AE asthma> bronchitis Leukocytosis> likely due to steroids P:   -given duration of symptoms will add ceftriaxone in addition to azithro (CAP organisms though she doesn't have CAP)  Yolonda Kida PCCM Pager: [phone] Cell: [phone] If no response, call [phone]   12/23/2012, 7:35 AM

## 2012-12-23 NOTE — Progress Notes (Addendum)
Nicole White WJX:[id number] DOB: 1990-11-11 DOA: 12/21/2012 PCP: No PCP Per Patient  Brief narrative: 22 year old ?, known childhood asthma, allergies, never intubated, noncompliant with medications secondary to cost, no PCP admitted to hospital 12/21/12-recently seen 12/7 given rescue inhaler and prednisone taper.  Noted sustained tachycardia during admission. Has been seen in the emergency room 7 times in 2014. Seemed to decompensate overnight 12/13 and had Increased WOB and worsening wheeze     Past medical history-As per Problem list Chart reviewed as below- Reviewed  Consultants:   none  Procedures:  Chest x-ray  Antibiotics:  Z-Pak 12/11  Ceftriaxone 12/13   Subjective  Overnight events noted Patient had increased work of breathing hyperventilation spells. More wheezy Still talking in full sentences now however Tolerating diet. Seems more tired than usual. Has a headache since yesterday. Has some mild central chest pain from coughing    Objective    Interim History: None  Telemetry: Sinus tach 1:30 to 140  Objective: Filed Vitals:   12/23/12 0422 12/23/12 0500 12/23/12 0605 12/23/12 0738  BP: 113/56     Pulse: 69 79    Temp:      TempSrc:      Resp: 17 19    Height:      Weight:      SpO2: 99% 100% 100% 92%    Intake/Output Summary (Last 24 hours) at 12/23/12 0805 Last data filed at 12/22/12 1300  Gross per 24 hour  Intake    600 ml  Output      0 ml  Net    600 ml    Exam:  General: EOMI, NCAT, no acute respiratory distress Body mass index is 26.95 kg/(m^2). Cardiovascular: S1-S2 no murmur rub or gallop , tachycardic Respiratory: Diffuse wheezes bilaterally all for lung fields Abdomen: Soft nontender nondistended no rebound Skin no lower extremity edema Neuro intact  Data Reviewed: Basic Metabolic Panel:  Recent Labs Lab 12/21/12 1610 12/22/12 0445  NA 140 139  K 3.0* 4.4  CL 105 107  CO2 23 21  GLUCOSE 105* 162*  BUN 6 9   CREATININE 0.83 0.82  CALCIUM 9.4 9.5   Liver Function Tests: No results found for this basename: AST, ALT, ALKPHOS, BILITOT, PROT, ALBUMIN,  in the last 168 hours No results found for this basename: LIPASE, AMYLASE,  in the last 168 hours No results found for this basename: AMMONIA,  in the last 168 hours CBC:  Recent Labs Lab 12/21/12 1610 12/22/12 0445 12/23/12 0713  WBC 19.4* 23.1* 41.7*  NEUTROABS 15.7*  --  PENDING  HGB 14.1 13.5 13.6  HCT 39.2 38.9 39.0  MCV 78.9 79.2 80.1  PLT 245 268 295   Cardiac Enzymes: No results found for this basename: CKTOTAL, CKMB, CKMBINDEX, TROPONINI,  in the last 168 hours BNP: No components found with this basename: POCBNP,  CBG: No results found for this basename: GLUCAP,  in the last 168 hours  Recent Results (from the past 240 hour(s))  RAPID STREP SCREEN     Status: None   Collection Time    12/17/12  2:31 PM      Result Value Range Status   Streptococcus, Group A Screen (Direct) NEGATIVE  NEGATIVE Final   Comment: (NOTE)     A Rapid Antigen test may result negative if the antigen level in the     sample is below the detection level of this test. The FDA has not     cleared this test as  a stand-alone test therefore the rapid antigen     negative result has reflexed to a Group A Strep culture.  CULTURE, GROUP A STREP     Status: None   Collection Time    12/17/12  2:31 PM      Result Value Range Status   Specimen Description THROAT   Final   Special Requests NONE   Final   Culture     Final   Value: No Beta Hemolytic Streptococci Isolated     Performed at [facility]   Report Status 12/19/2012 FINAL   Final  MRSA PCR SCREENING     Status: None   Collection Time    12/23/12 12:06 AM      Result Value Range Status   MRSA by PCR NEGATIVE  NEGATIVE Final   Comment:            The GeneXpert MRSA Assay (FDA     approved for NASAL specimens     only), is one component of a     comprehensive MRSA colonization      surveillance program. It is not     intended to diagnose MRSA     infection nor to guide or     monitor treatment for     MRSA infections.     Studies:              All Imaging reviewed and is as per above notation   Scheduled Meds: . azithromycin  250 mg Oral Daily  . cefTRIAXone (ROCEPHIN)  IV  2 g Intravenous Q24H  . enoxaparin (LOVENOX) injection  40 mg Subcutaneous Q24H  . guaiFENesin  600 mg Oral BID  . ipratropium  0.5 mg Nebulization QID  . methylPREDNISolone (SOLU-MEDROL) injection  60 mg Intravenous Q6H  . sodium chloride  3 mL Intravenous Q12H   Continuous Infusions:    Assessment/Plan: 1. Acute respiratory failure with asthma exacerbation with superimposed bronchitis-patient has been transferred to step down unit overnight 12/13. Will need blood gas and closer monitoring. Appreciate pulmonary assistance-they have started ceftriaxone 2 g q. 24 hourly, restarted IV Solu-Medrol 60 mg every 6 hourly, Mucinex 600 twice a day, continue azithromycin--influenza panel ordered 2. Sinus tachycardia likely reactive secondary to steroids.  Continue monitoring on telemetry 3. Noncompliance to medical therapy secondary to lack of funds 4. Unemployed state  Code Status: Full Family Communication: None at bedside Disposition Plan: The patient   Pleas Koch, MD  [facility] Pager [phone] 12/23/2012, 8:05 AM    LOS: 2 days

## 2012-12-23 NOTE — Progress Notes (Signed)
Called into patient's room earlier during the shift with patient complaining of SOB. Sats were 100% on 4 L N/C, but patient's work of breathing was increased. Patient tachycardic and tachypnic. Patient's only breathing tx available was xopenex PRN Q6, which patient had just a couple of hours before. Burnadette Peter, NP paged and RRT called to evaluate. RRT said patient was having bronchospasms and wheezes. Recommended 10 mg albuterol CAT versus transfer to SDU with q2h xopenox. RRT collaborated with Burnadette Peter, NP and Nehemiah Settle, ICU CN. Decision was made to transfer patient to SDU to receive q2h xopenex. Patient informed and report given to SDU RN. Patient then transferred to room SDU room 1222.

## 2012-12-24 MED ORDER — MOMETASONE FURO-FORMOTEROL FUM 200-5 MCG/ACT IN AERO
2.0000 | INHALATION_SPRAY | Freq: Two times a day (BID) | RESPIRATORY_TRACT | Status: DC
  Administered 2012-12-24 – 2012-12-25 (×2): 2 via RESPIRATORY_TRACT
  Filled 2012-12-24 (×2): qty 8.8

## 2012-12-24 MED ORDER — MENTHOL 3 MG MT LOZG
1.0000 | LOZENGE | OROMUCOSAL | Status: DC | PRN
  Administered 2012-12-24: 3 mg via ORAL
  Filled 2012-12-24 (×2): qty 9

## 2012-12-24 MED ORDER — LEVOFLOXACIN 750 MG PO TABS
750.0000 mg | ORAL_TABLET | Freq: Every day | ORAL | Status: DC
  Administered 2012-12-25: 750 mg via ORAL
  Filled 2012-12-24: qty 1

## 2012-12-24 MED ORDER — MOMETASONE FURO-FORMOTEROL FUM 100-5 MCG/ACT IN AERO
2.0000 | INHALATION_SPRAY | Freq: Two times a day (BID) | RESPIRATORY_TRACT | Status: DC
  Administered 2012-12-24: 2 via RESPIRATORY_TRACT
  Filled 2012-12-24: qty 8.8

## 2012-12-24 NOTE — Consult Note (Signed)
PULMONARY  / CRITICAL CARE MEDICINE  Name: Nicole White MRN: [id number] DOB: 12/11/1990    ADMISSION DATE:  12/21/2012 CONSULTATION DATE:  12/23/2012  REFERRING MD :  Mahala Menghini PRIMARY SERVICE: [facility]  CHIEF COMPLAINT:  Shortness of breath  BRIEF PATIENT DESCRIPTION: 22 y/o female with asthma admitted on 12/11 with dyspnea and cough due to a severe asthma flare.  PCCM consulted on 12/13 for worsening dyspnea.  SIGNIFICANT EVENTS / STUDIES:    LINES / TUBES:   CULTURES: 12/13 influenza rapid > 12/13 influenza pcr >  ANTIBIOTICS: 12/11 azithro >> 12/13 ceftriaxone >>  SUBJECTIVE: Feels much better today  VITAL SIGNS: Temp:  [97.5 F (36.4 C)-98.9 F (37.2 C)] 97.5 F (36.4 C) (12/14 0800) Pulse Rate:  [64-134] 66 (12/14 0800) Resp:  [17-34] 19 (12/14 0800) BP: (104-142)/(62-94) 117/81 mmHg (12/14 0800) SpO2:  [94 %-100 %] 97 % (12/14 0845) HEMODYNAMICS:   VENTILATOR SETTINGS:   INTAKE / OUTPUT: Intake/Output     12/13 0701 - 12/14 0700 12/14 0701 - 12/15 0700   P.O. 750    I.V. (mL/kg) 10 (0.2)    IV Piggyback 50    Total Intake(mL/kg) 810 (12.9)    Net +810          Urine Occurrence 3 x    Stool Occurrence 1 x      PHYSICAL EXAMINATION: General:  Sitting up in bed, speaking in full sentences Neuro:  A&Ox4, MAEW HEENT:  NCAT, PERRL, EOMi Cardiovascular:  RRR, no mgr Lungs:  Good air movement, minimal wheezing Abdomen:  BS+, soft, nontender Musculoskeletal:  Warm, no edema, normal bulk and tone Skin:  No rash  LABS:  CBC  Recent Labs Lab 12/21/12 1610 12/22/12 0445 12/23/12 0713  WBC 19.4* 23.1* 41.7*  HGB 14.1 13.5 13.6  HCT 39.2 38.9 39.0  PLT 245 268 295   Coag's No results found for this basename: APTT, INR,  in the last 168 hours BMET  Recent Labs Lab 12/21/12 1610 12/22/12 0445  NA 140 139  K 3.0* 4.4  CL 105 107  CO2 23 21  BUN 6 9  CREATININE 0.83 0.82  GLUCOSE 105* 162*   Electrolytes  Recent Labs Lab 12/21/12 1610  12/22/12 0445  CALCIUM 9.4 9.5   Sepsis Markers No results found for this basename: LATICACIDVEN, PROCALCITON, O2SATVEN,  in the last 168 hours ABG  Recent Labs Lab 12/23/12 0810  PHART 7.413  PCO2ART 40.6  PO2ART 65.4*   Liver Enzymes No results found for this basename: AST, ALT, ALKPHOS, BILITOT, ALBUMIN,  in the last 168 hours Cardiac Enzymes No results found for this basename: TROPONINI, PROBNP,  in the last 168 hours Glucose No results found for this basename: GLUCAP,  in the last 168 hours  Imaging Dg Chest Port 1 View  12/23/2012   CLINICAL DATA:  Shortness of breath, asthma  EXAM: PORTABLE CHEST - 1 VIEW  COMPARISON:  12/21/2012  FINDINGS: The heart size and mediastinal contours are within normal limits. Both lungs are clear. The visualized skeletal structures are unremarkable.  IMPRESSION: No active disease.   Electronically Signed   By: Ruel Favors M.D.   On: 12/23/2012 08:03     12/13 portable CXR: lungs clear, no infiltrate  ASSESSMENT / PLAN:  PULMONARY A: AE asthma presumably from a viral illness> improving Major barrier to care here is going to be financial issues, ability to pay for inhaled corticosteroid P:   -wean steroids -change antibiotics to Levaquin oral given improvement  and lack of IV -continue dulera (200/4.5 2 puffs bid) -social work consult to assistance getting insurance -f/u with [facility] in outpatient pulmonary clinic, [facility]  INFECTIOUS A:  AE asthma> bronchitis Leukocytosis> likely due to steroids P:   -Levaquin, complete 5 day course of antibiotics  PCCM to sign off, call if questions  Yolonda Kida PCCM Pager: [phone] Cell: [phone] If no response, call [phone]   12/24/2012, 10:42 AM

## 2012-12-24 NOTE — Progress Notes (Signed)
Pt ambulated around entire unit, tolerated well, sats remained 98%, HR 90's.  Only complaint was occasional dizziness that was relieved by resting for  Short while.

## 2012-12-24 NOTE — Progress Notes (Signed)
Nicole White ZOX:[id number] DOB: [birth year] DOA: 12/21/2012 PCP: No PCP Per Patient  Brief narrative: 22 year old ?, known childhood asthma, allergies, never intubated, noncompliant with medications secondary to cost, no PCP admitted to hospital 12/21/12-recently seen 12/7 given rescue inhaler and prednisone taper.  Noted sustained tachycardia during admission. Has been seen in the emergency room 7 times in 2014. Seemed to decompensate overnight 12/13 and had Increased WOB and worsening wheeze     Past medical history-As per Problem list Chart reviewed as below- Reviewed  Consultants:   none  Procedures:  Chest x-ray  Antibiotics:  Z-Pak 12/11  Ceftriaxone 12/13   Subjective   Patient still wheezy/tired wheezy Talng in full sentences now however Tolerating diet.     Objective    Interim History: None  Telemetry: Sinus tach 1:30 to 140  Objective: Filed Vitals:   12/24/12 0600 12/24/12 0700 12/24/12 0800 12/24/12 0845  BP: 104/69  117/81   Pulse: 78 71 66   Temp:   97.5 F (36.4 C)   TempSrc:   Oral   Resp: 34 17 19   Height:      Weight:      SpO2: 94% 96% 96% 97%    Intake/Output Summary (Last 24 hours) at 12/24/12 0927 Last data filed at 12/24/12 0000  Gross per 24 hour  Intake    760 ml  Output      0 ml  Net    760 ml    Exam:  General: EOMI, NCAT, no acute respiratory distress Body mass index is 26.95 kg/(m^2). Cardiovascular: S1-S2 no murmur rub or gallop , tachycardic Respiratory: Diffuse wheezes bilaterally all for lung fields~ same as prior Abdomen: Soft nontender nondistended no rebound Skin no lower extremity edema Neuro intact  Data Reviewed: Basic Metabolic Panel:  Recent Labs Lab 12/21/12 1610 12/22/12 0445  NA 140 139  K 3.0* 4.4  CL 105 107  CO2 23 21  GLUCOSE 105* 162*  BUN 6 9  CREATININE 0.83 0.82  CALCIUM 9.4 9.5   Liver Function Tests: No results found for this basename: AST, ALT, ALKPHOS, BILITOT,  PROT, ALBUMIN,  in the last 168 hours No results found for this basename: LIPASE, AMYLASE,  in the last 168 hours No results found for this basename: AMMONIA,  in the last 168 hours CBC:  Recent Labs Lab 12/21/12 1610 12/22/12 0445 12/23/12 0713  WBC 19.4* 23.1* 41.7*  NEUTROABS 15.7*  --  36.3*  HGB 14.1 13.5 13.6  HCT 39.2 38.9 39.0  MCV 78.9 79.2 80.1  PLT 245 268 295   Cardiac Enzymes: No results found for this basename: CKTOTAL, CKMB, CKMBINDEX, TROPONINI,  in the last 168 hours BNP: No components found with this basename: POCBNP,  CBG: No results found for this basename: GLUCAP,  in the last 168 hours  Recent Results (from the past 240 hour(s))  RAPID STREP SCREEN     Status: None   Collection Time    12/17/12  2:31 PM      Result Value Range Status   Streptococcus, Group A Screen (Direct) NEGATIVE  NEGATIVE Final   Comment: (NOTE)     A Rapid Antigen test may result negative if the antigen level in the     sample is below the detection level of this test. The FDA has not     cleared this test as a stand-alone test therefore the rapid antigen     negative result has reflexed to a Group A Strep  culture.  CULTURE, GROUP A STREP     Status: None   Collection Time    12/17/12  2:31 PM      Result Value Range Status   Specimen Description THROAT   Final   Special Requests NONE   Final   Culture     Final   Value: No Beta Hemolytic Streptococci Isolated     Performed at [facility]   Report Status 12/19/2012 FINAL   Final  MRSA PCR SCREENING     Status: None   Collection Time    12/23/12 12:06 AM      Result Value Range Status   MRSA by PCR NEGATIVE  NEGATIVE Final   Comment:            The GeneXpert MRSA Assay (FDA     approved for NASAL specimens     only), is one component of a     comprehensive MRSA colonization     surveillance program. It is not     intended to diagnose MRSA     infection nor to guide or     monitor treatment for     MRSA  infections.     Studies:              All Imaging reviewed and is as per above notation   Scheduled Meds: . azithromycin  250 mg Oral Daily  . cefTRIAXone (ROCEPHIN)  IV  2 g Intravenous Q24H  . enoxaparin (LOVENOX) injection  40 mg Subcutaneous Q24H  . guaiFENesin  600 mg Oral BID  . ipratropium  0.5 mg Nebulization QID  . methylPREDNISolone (SOLU-MEDROL) injection  60 mg Intravenous Q6H  . mometasone-formoterol  2 puff Inhalation BID  . sodium chloride  3 mL Intravenous Q12H   Continuous Infusions:    Assessment/Plan: 1. Acute respiratory failure with asthma exacerbation with superimposed bronchitis-patient has been transferred to step down unit overnight 12/13. Will need blood gas and closer monitoring. Appreciate pulmonary assistance-they have started ceftriaxone 2 g q. 24 hourly, restarted IV Solu-Medrol 60 mg every 6 hourly, Mucinex 600 twice a day, continue azithromycin--influenza panel neg.  Added Advair to Tiotropium and Albuterol.  Close monitoring necessary on SDU today.  If better, transfer to tele in am 2. Sinus tachycardia likely reactive secondary to steroids.  Continue monitoring on telemetry 3. Noncompliance to medical therapy secondary to lack of funds 4. Unemployed state  Code Status: Full Family Communication: Boyfriend present at bedisde Disposition Plan: home when stable  Pleas Koch, MD  [facility] Pager [phone] 12/24/2012, 9:27 AM    LOS: 3 days

## 2012-12-24 NOTE — Progress Notes (Signed)
Pt transferred to rm 1404. Cond stable

## 2012-12-25 MED ORDER — ACETAMINOPHEN 325 MG PO TABS: 650.0000 mg | ORAL_TABLET | Freq: Four times a day (QID) | ORAL | Status: DC | PRN

## 2012-12-25 MED ORDER — ALBUTEROL SULFATE HFA 108 (90 BASE) MCG/ACT IN AERS: 1.0000 | INHALATION_SPRAY | Freq: Four times a day (QID) | RESPIRATORY_TRACT | Status: DC | PRN

## 2012-12-25 MED ORDER — PREDNISONE 50 MG PO TABS
50.0000 mg | ORAL_TABLET | Freq: Every day | ORAL | Status: DC
Start: 2012-12-25 — End: 2012-12-25
  Administered 2012-12-25: 12:00:00 50 mg via ORAL
  Filled 2012-12-25 (×2): qty 1

## 2012-12-25 MED ORDER — NAPROXEN 375 MG PO TABS
375.0000 mg | ORAL_TABLET | Freq: Two times a day (BID) | ORAL | Status: DC
  Administered 2012-12-25: 375 mg via ORAL
  Filled 2012-12-25 (×3): qty 1

## 2012-12-25 MED ORDER — PREDNISONE 50 MG PO TABS: ORAL_TABLET | ORAL | Status: DC

## 2012-12-25 MED ORDER — ALBUTEROL SULFATE HFA 108 (90 BASE) MCG/ACT IN AERS
1.0000 | INHALATION_SPRAY | Freq: Four times a day (QID) | RESPIRATORY_TRACT | Status: DC
  Administered 2012-12-25: 2 via RESPIRATORY_TRACT
  Filled 2012-12-25: qty 6.7

## 2012-12-25 MED ORDER — MOMETASONE FURO-FORMOTEROL FUM 200-5 MCG/ACT IN AERO: 2.0000 | INHALATION_SPRAY | Freq: Two times a day (BID) | RESPIRATORY_TRACT | Status: DC

## 2012-12-25 MED ORDER — NAPROXEN 375 MG PO TABS: 375.0000 mg | ORAL_TABLET | Freq: Two times a day (BID) | ORAL | Status: DC

## 2012-12-25 NOTE — Care Management Note (Signed)
    Page 1 of 2   12/25/2012     1:31:13 PM   CARE MANAGEMENT NOTE 12/25/2012  Patient:  SIRENIA, WHITIS   Account Number:  [id number]  Date Initiated:  12/22/2012  Documentation initiated by:  Ezekiel Ina  Subjective/Objective Assessment:   pt admitted with cco SOB, Asthma, second time to ED     Action/Plan:   from home   Anticipated DC Date:  12/25/2012   Anticipated DC Plan:  HOME/SELF CARE      DC Planning Services  CM consult  MATCH Program  Medication Assistance  Follow-up appt scheduled      Choice offered to / List presented to:             Status of service:  Completed, signed off Medicare Important Message given?  NA - LOS <3 / Initial given by admissions (If response is "NO", the following Medicare IM given date fields will be blank) Date Medicare IM given:   Date Additional Medicare IM given:    Discharge Disposition:  HOME/SELF CARE  Per UR Regulation:  Reviewed for med. necessity/level of care/duration of stay  If discussed at Long Length of Stay Meetings, dates discussed:    Comments:  12/24/12 Emersyn Kotarski RN,BSN NCM [phone] TRANSFER FROM 4W.F/U ON MATCH PROGRAM-PATIENT HAS FORM,& VOICED UNDERSTANDING OF [facility]-SHE WILL GO TO [facility](FORM & SCRIPT),AWARE OF NO NARCOTICES OR OTC MEDS APPLY,SHE HAS $3 CO PAY FOR EACH SCRIPT THAT QUALIFIES FOR MATCH PROGRAM,1 TIME USE IN CAL YEAR.HAS PCP APPT SET UP ALREADY. NO FURTHER D/C NEEDS.  12/22/12 MMcGibboney, RN, BSN Follow up appointment 01/23/13 at 1200, at [facility] and [facility]. Information given to pt and encouraged her to keep appointment.  12/22/12 MMcGibboney, RN, BSN Chart reviewed. Pt setup with the [facility] program, Teach Back given. Information given to pt to call for follow up appointment with [facility] and [facility]

## 2012-12-25 NOTE — Discharge Summary (Signed)
Physician Discharge Summary  Carnella Fryman ZOX:[id number] DOB: 05/16/1990 DOA: 12/21/2012  PCP: No PCP Per Patient  Admit date: 12/21/2012 Discharge date: 12/25/2012  Time spent: 30 minutes  Recommendations for Outpatient Follow-up:  1. Patient needs to be compliant with medications  2. Avoid allergens 3. Advised to avoid cigarette smoke secondhand from point  Discharge Diagnoses:  Principal Problem:   Asthma exacerbation Active Problems:   Acute bronchitis with bronchospasm   Hypokalemia   Leukocytosis   Sinus tachycardia   Discharge Condition: stable  Diet recommendation: regular  Filed Weights   12/21/12 2156 12/23/12 0000 12/23/12 0400  Weight: 60.3 kg (132 lb 15 oz) 62.8 kg (138 lb 7.2 oz) 62.6 kg (138 lb 0.1 oz)    History of present illness:  22 year old ?, known childhood asthma, allergies, never intubated, noncompliant with medications secondary to cost, no PCP admitted to hospital 12/21/12-recently seen 12/7 given rescue inhaler and prednisone taper. Noted sustained tachycardia during admission.  Has been seen in the emergency room 7 times in 2014.  Seemed to decompensate overnight 12/13 and had Increased WOB and worsening wheeze     Assessment/Plan:  1. Acute respiratory failure with asthma exacerbation with superimposed bronchitis-patient has been transferred to step down unit overnight 12/13. Blood gas was stable and patient stayed in stepped-down for 2 nights. She received IV Solu-Medrol which was transitioned to prednisone 50 mg burst 7 days Added Advair to Tiotropium and Albuterol. Sure that he has been set up with an outpatient appointment with Dr. Kendrick Fries of pulmonary and will be seeing him soon in addition to getting a care physician 2. Sinus tachycardia likely reactive secondary to steroids. This stabilize during hospital stay it was probably reactive to beta 2 agonist 3. Noncompliance to medical therapy secondary to lack of funds 4. Unemployed  state  Consultants:  none Procedures:  Chest x-ray Antibiotics:  Z-Pak 12/11 -12/14 Ceftriaxone 12/13-12/14 Levaquin x 1   Discharge Exam: Filed Vitals:   12/25/12 0532  BP: 118/81  Pulse: 59  Temp: 97.7 F (36.5 C)  Resp: 16   Doing well sleepy  General: eomi, ncat Cardiovascular:  s1 s2 no m/r/g Respiratory:  Clear, no added sound  Discharge Instructions  Discharge Orders   Future Appointments Provider Department Dept Phone   01/23/2013 12:00 PM Doris Cheadle, MD [facility] [phone]   Future Orders Complete By Expires   Diet - low sodium heart healthy  As directed    Increase activity slowly  As directed        Medication List         acetaminophen 325 MG tablet  Commonly known as:  TYLENOL  Take 2 tablets (650 mg total) by mouth every 6 (six) hours as needed for mild pain (or Fever >/= 101).     albuterol 108 (90 BASE) MCG/ACT inhaler  Commonly known as:  PROVENTIL HFA;VENTOLIN HFA  Inhale 1-2 puffs into the lungs every 6 (six) hours as needed for wheezing.     dextromethorphan 30 MG/5ML liquid  Commonly known as:  DELSYM  Take 2.5 mLs (15 mg total) by mouth as needed for cough.     guaiFENesin 100 MG/5ML liquid  Commonly known as:  ROBITUSSIN  Take 200 mg by mouth 3 (three) times daily as needed for cough.     mometasone-formoterol 200-5 MCG/ACT Aero  Commonly known as:  DULERA  Inhale 2 puffs into the lungs 2 (two) times daily.     naproxen 375 MG  tablet  Commonly known as:  NAPROSYN  Take 1 tablet (375 mg total) by mouth 2 (two) times daily with a meal.     NYQUIL PO  Take 30 mLs by mouth at bedtime as needed (for cold symptoms).     predniSONE 50 MG tablet  Commonly known as:  DELTASONE  Finish course       Allergies  Allergen Reactions  . Peanut-Containing Drug Products Anaphylaxis  . Coconut Flavor Swelling   Follow-up Information   Follow up with Jeanann Lewandowsky, MD On 01/23/2013.  (appointment 12:00 )    Specialty:  Internal Medicine   Contact information:   [address] [place] [address] [phone]       Follow up with Max Fickle, MD. Schedule an appointment as soon as possible for a visit in 3 weeks. Manufacturing engineer office in [place], not [facility])    Specialty:  Pulmonary Disease   Contact information:   [address] [place] [address] [place] [address] [phone]       Follow up with Doris Cheadle, MD On 01/23/2013. (12:00 pm)    Specialty:  Internal Medicine   Contact information:   [address] [place] [address] [phone]        The results of significant diagnostics from this hospitalization (including imaging, microbiology, ancillary and laboratory) are listed below for reference.    Significant Diagnostic Studies: Dg Chest 2 View  12/21/2012   CLINICAL DATA:  Cough and dyspnea  EXAM: CHEST  2 VIEW  COMPARISON:  November 20, 2012  FINDINGS: There is chronic central peribronchial thickening consistent with bronchitis. There is no edema or consolidation. Heart size and pulmonary vascularity are normal. No adenopathy. No bone lesions.  IMPRESSION: Evidence of chronic bronchitis centrally. No edema or consolidation.   Electronically Signed   By: Bretta Bang M.D.   On: 12/21/2012 13:48   Dg Chest Port 1 View  12/23/2012   CLINICAL DATA:  Shortness of breath, asthma  EXAM: PORTABLE CHEST - 1 VIEW  COMPARISON:  12/21/2012  FINDINGS: The heart size and mediastinal contours are within normal limits. Both lungs are clear. The visualized skeletal structures are unremarkable.  IMPRESSION: No active disease.   Electronically Signed   By: Ruel Favors M.D.   On: 12/23/2012 08:03    Microbiology: Recent Results (from the past 240 hour(s))  RAPID STREP SCREEN     Status: None   Collection Time    12/17/12  2:31 PM      Result Value Range Status   Streptococcus, Group A Screen (Direct) NEGATIVE  NEGATIVE Final    Comment: (NOTE)     A Rapid Antigen test may result negative if the antigen level in the     sample is below the detection level of this test. The FDA has not     cleared this test as a stand-alone test therefore the rapid antigen     negative result has reflexed to a Group A Strep culture.  CULTURE, GROUP A STREP     Status: None   Collection Time    12/17/12  2:31 PM      Result Value Range Status   Specimen Description THROAT   Final   Special Requests NONE   Final   Culture     Final   Value: No Beta Hemolytic Streptococci Isolated     Performed at [facility]   Report Status 12/19/2012 FINAL   Final  MRSA PCR SCREENING  Status: None   Collection Time    12/23/12 12:06 AM      Result Value Range Status   MRSA by PCR NEGATIVE  NEGATIVE Final   Comment:            The GeneXpert MRSA Assay (FDA     approved for NASAL specimens     only), is one component of a     comprehensive MRSA colonization     surveillance program. It is not     intended to diagnose MRSA     infection nor to guide or     monitor treatment for     MRSA infections.     Labs: Basic Metabolic Panel:  Recent Labs Lab 12/21/12 1610 12/22/12 0445  NA 140 139  K 3.0* 4.4  CL 105 107  CO2 23 21  GLUCOSE 105* 162*  BUN 6 9  CREATININE 0.83 0.82  CALCIUM 9.4 9.5   Liver Function Tests: No results found for this basename: AST, ALT, ALKPHOS, BILITOT, PROT, ALBUMIN,  in the last 168 hours No results found for this basename: LIPASE, AMYLASE,  in the last 168 hours No results found for this basename: AMMONIA,  in the last 168 hours CBC:  Recent Labs Lab 12/21/12 1610 12/22/12 0445 12/23/12 0713  WBC 19.4* 23.1* 41.7*  NEUTROABS 15.7*  --  36.3*  HGB 14.1 13.5 13.6  HCT 39.2 38.9 39.0  MCV 78.9 79.2 80.1  PLT 245 268 295   Cardiac Enzymes: No results found for this basename: CKTOTAL, CKMB, CKMBINDEX, TROPONINI,  in the last 168 hours BNP: BNP (last 3 results) No results found for  this basename: PROBNP,  in the last 8760 hours CBG: No results found for this basename: GLUCAP,  in the last 168 hours     Signed:  Rhetta Mura  [facility] 12/25/2012, 2:00 PM

## 2013-01-23 ENCOUNTER — Inpatient Hospital Stay: Payer: Self-pay

## 2013-02-13 ENCOUNTER — Encounter (HOSPITAL_COMMUNITY): Payer: Self-pay | Admitting: Emergency Medicine

## 2013-02-13 ENCOUNTER — Emergency Department (HOSPITAL_COMMUNITY)
Admission: EM | Admit: 2013-02-13 | Discharge: 2013-02-13 | Disposition: A | Discharge status: Home / Self Care | Payer: Self-pay | Attending: Emergency Medicine | Admitting: Emergency Medicine

## 2013-02-13 ENCOUNTER — Emergency Department (HOSPITAL_COMMUNITY): Payer: Self-pay

## 2013-02-13 DIAGNOSIS — R5383 Other fatigue: Secondary | ICD-10-CM

## 2013-02-13 DIAGNOSIS — J40 Bronchitis, not specified as acute or chronic: Secondary | ICD-10-CM

## 2013-02-13 DIAGNOSIS — R112 Nausea with vomiting, unspecified: Secondary | ICD-10-CM | POA: Insufficient documentation

## 2013-02-13 DIAGNOSIS — J45901 Unspecified asthma with (acute) exacerbation: Secondary | ICD-10-CM | POA: Insufficient documentation

## 2013-02-13 DIAGNOSIS — Z3202 Encounter for pregnancy test, result negative: Secondary | ICD-10-CM | POA: Insufficient documentation

## 2013-02-13 DIAGNOSIS — IMO0002 Reserved for concepts with insufficient information to code with codable children: Secondary | ICD-10-CM | POA: Insufficient documentation

## 2013-02-13 DIAGNOSIS — Z79899 Other long term (current) drug therapy: Secondary | ICD-10-CM | POA: Insufficient documentation

## 2013-02-13 DIAGNOSIS — R5381 Other malaise: Secondary | ICD-10-CM | POA: Insufficient documentation

## 2013-02-13 LAB — CBC WITH DIFFERENTIAL/PLATELET
Basophils Absolute: 0 10*3/uL (ref 0.0–0.1)
Basophils Relative: 0 % (ref 0–1)
Eosinophils Absolute: 0.1 10*3/uL (ref 0.0–0.7)
Eosinophils Relative: 1 % (ref 0–5)
HCT: 42.3 % (ref 36.0–46.0)
Hemoglobin: 14.6 g/dL (ref 12.0–15.0)
Lymphocytes Relative: 11 % — ABNORMAL LOW (ref 12–46)
Lymphs Abs: 2.1 10*3/uL (ref 0.7–4.0)
MCH: 27.8 pg (ref 26.0–34.0)
MCHC: 34.5 g/dL (ref 30.0–36.0)
MCV: 80.6 fL (ref 78.0–100.0)
Monocytes Absolute: 0.8 10*3/uL (ref 0.1–1.0)
Monocytes Relative: 4 % (ref 3–12)
Neutro Abs: 16.4 10*3/uL — ABNORMAL HIGH (ref 1.7–7.7)
Neutrophils Relative %: 85 % — ABNORMAL HIGH (ref 43–77)
Platelets: 224 10*3/uL (ref 150–400)
RBC: 5.25 MIL/uL — ABNORMAL HIGH (ref 3.87–5.11)
RDW: 13.8 % (ref 11.5–15.5)
WBC: 19.4 10*3/uL — ABNORMAL HIGH (ref 4.0–10.5)

## 2013-02-13 LAB — BASIC METABOLIC PANEL
BUN: 6 mg/dL (ref 6–23)
CO2: 26 mEq/L (ref 19–32)
Calcium: 9.4 mg/dL (ref 8.4–10.5)
Chloride: 102 mEq/L (ref 96–112)
Creatinine, Ser: 0.78 mg/dL (ref 0.50–1.10)
GFR calc Af Amer: >90 mL/min (ref >90)
GFR calc non Af Amer: >90 mL/min (ref >90)
Glucose, Bld: 107 mg/dL — ABNORMAL HIGH (ref 70–99)
Potassium: 3.8 mEq/L (ref 3.7–5.3)
Sodium: 140 mEq/L (ref 137–147)

## 2013-02-13 LAB — URINALYSIS, ROUTINE W REFLEX MICROSCOPIC
Bilirubin Urine: NEGATIVE
Glucose, UA: NEGATIVE mg/dL
Hgb urine dipstick: NEGATIVE
Ketones, ur: NEGATIVE mg/dL
Leukocytes, UA: NEGATIVE
Nitrite: NEGATIVE
Protein, ur: NEGATIVE mg/dL
Specific Gravity, Urine: 1.012 (ref 1.005–1.030)
Urobilinogen, UA: 1 mg/dL (ref 0.0–1.0)
pH: 7 (ref 5.0–8.0)

## 2013-02-13 LAB — POCT PREGNANCY, URINE: PREG TEST UR: NEGATIVE

## 2013-02-13 MED ORDER — GUAIFENESIN ER 1200 MG PO TB12: 1.0000 | ORAL_TABLET | Freq: Two times a day (BID) | ORAL | Status: DC

## 2013-02-13 MED ORDER — PROMETHAZINE-DM 6.25-15 MG/5ML PO SYRP: 5.0000 mL | ORAL_SOLUTION | Freq: Four times a day (QID) | ORAL | Status: DC | PRN

## 2013-02-13 MED ORDER — METHYLPREDNISOLONE SODIUM SUCC 125 MG IJ SOLR
125.0000 mg | Freq: Once | INTRAMUSCULAR | Status: DC
  Filled 2013-02-13: qty 2

## 2013-02-13 MED ORDER — ALBUTEROL SULFATE HFA 108 (90 BASE) MCG/ACT IN AERS
2.0000 | INHALATION_SPRAY | RESPIRATORY_TRACT | Status: DC | PRN
  Filled 2013-02-13: qty 6.7

## 2013-02-13 MED ORDER — PREDNISONE 50 MG PO TABS: 50.0000 mg | ORAL_TABLET | Freq: Every day | ORAL | Status: DC

## 2013-02-13 MED ORDER — ALBUTEROL (5 MG/ML) CONTINUOUS INHALATION SOLN
10.0000 mg/h | INHALATION_SOLUTION | RESPIRATORY_TRACT | Status: AC
  Administered 2013-02-13: 10 mg/h via RESPIRATORY_TRACT
  Filled 2013-02-13: qty 20

## 2013-02-13 MED ORDER — ALBUTEROL SULFATE (2.5 MG/3ML) 0.083% IN NEBU: 5.0000 mg | INHALATION_SOLUTION | Freq: Once | RESPIRATORY_TRACT | Status: DC

## 2013-02-13 MED ORDER — SODIUM CHLORIDE 0.9 % IV BOLUS (SEPSIS)
1000.0000 mL | Freq: Once | INTRAVENOUS | Status: AC
Start: 2013-02-13 — End: 2013-02-13
  Administered 2013-02-13: 1000 mL via INTRAVENOUS

## 2013-02-13 MED ORDER — IPRATROPIUM BROMIDE 0.02 % IN SOLN
0.5000 mg | Freq: Once | RESPIRATORY_TRACT | Status: AC
  Administered 2013-02-13: 0.5 mg via RESPIRATORY_TRACT
  Filled 2013-02-13: qty 2.5

## 2013-02-13 NOTE — Progress Notes (Signed)
   CARE MANAGEMENT ED NOTE 02/13/2013  Patient:  Nicole White,Nicole White   Account Number:  [id number]  Date Initiated:  02/13/2013  Documentation initiated by:  Radford PaxFERRERO,Desjuan Stearns  Subjective/Objective Assessment:   Patient presents to Ed with shortness of breath.     Subjective/Objective Assessment Detail:   Patient with history of asthma.     Action/Plan:   Action/Plan Detail:   Anticipated DC Date:  02/13/2013     Status Recommendation to Physician:   Result of Recommendation:    Other ED Services  Consult Working Plan    DC Planning Services  Other  PCP issues  Medication Assistance    Choice offered to / List presented to:            Status of service:  Completed, signed off  ED Comments:   ED Comments Detail:  EDCM spoke to patient at bedside.  Patient confirms she does not have a pcp or insurance.  [facility] provided patient with list of pcps who accept self pay patients, list of financial resources in the community such as local churches and [employer], list of discounted pharmacies and website needymeds.org for medication assistance, [employer], phone number to inquire about the orange card, [place] and wellness clinic, and dental assitance for patients without insurance.  Patient [facility]ablt [facility] schedule her an [facility].  Patient sent home with three prescriptions, two of which were over the counter.  Patient reports she has enough money or will be able to get the money for these medications.  Patient given bus pass for transportation.  No further EDCM needs at this time.

## 2013-02-13 NOTE — ED Notes (Signed)
Per EMS, pt has hx of asthma. Pt has had shortness of breath all day. Pt used her own albuterol inhaler, which did not provide relief. Pt was 5mg  albuterol, 500mcg atrovent, 125mg  solumedrol IV.

## 2013-02-13 NOTE — ED Provider Notes (Signed)
CSN: [id number]     Arrival date & time 02/13/13  [id number] History   First MD Initiated Contact with Patient 02/13/13 1841     Chief Complaint  Patient presents with  . Shortness of Breath   (Consider location/radiation/quality/duration/timing/severity/associated sxs/prior Treatment) HPI 23 yo black female with a PMH of asthma and bronchitis presents with shortness of breath that has been going on all day. She is prescribed albuterol and mometasone/formoterol for her asthma which she has been taking as prescribed. Her asthma medications are not helping her SOB which was getting progressively worse. She denies tobacco, alcohol, and recreational drug usage. She denies living with any animals or being around smokers or other  environmental allergens. She also has been feeling ill for 2-3 days prior to today. She reports nausea, vomiting x1, chills, and increased mucus production. She denies fever, diarrhea, dizziness, headache, blurred vision, neck pain, dysuria, abdominal pain, rash and chest pain.   Past Medical History  Diagnosis Date  . Asthma    Past Surgical History  Procedure Laterality Date  . No past surgeries     Family History  Problem Relation Age of Onset  . Asthma Father   . Autism Brother    History  Substance Use Topics  . Smoking status: Never Smoker   . Smokeless tobacco: Never Used  . Alcohol Use: No   OB History   Grav Para Term Preterm Abortions TAB SAB Ect Mult Living                 Review of Systems  Constitutional: Positive for chills and fatigue.  HENT: Positive for postnasal drip and rhinorrhea.   Respiratory: Positive for cough, shortness of breath and wheezing.   Cardiovascular: Negative.   Gastrointestinal: Positive for nausea and vomiting.    Allergies  Peanut-containing drug products and Coconut flavor  Home Medications   Current Outpatient Rx  Name  Route  Sig  Dispense  Refill  . acetaminophen (TYLENOL) 325 MG tablet   Oral   Take 2  tablets (650 mg total) by mouth every 6 (six) hours as needed for mild pain (or Fever >/= 101).         Marland Kitchen. albuterol (PROVENTIL HFA;VENTOLIN HFA) 108 (90 BASE) MCG/ACT inhaler   Inhalation   Inhale 1-2 puffs into the lungs every 6 (six) hours as needed for wheezing.   2 Inhaler   1   . dextromethorphan (DELSYM) 30 MG/5ML liquid   Oral   Take 30 mg by mouth as needed for cough (cough).         Marland Kitchen. guaiFENesin (ROBITUSSIN) 100 MG/5ML liquid   Oral   Take 200 mg by mouth 3 (three) times daily as needed for cough.         . mometasone-formoterol (DULERA) 200-5 MCG/ACT AERO   Inhalation   Inhale 2 puffs into the lungs 2 (two) times daily.   1 Inhaler   0   . Pseudoephedrine-APAP-DM (DAYQUIL MULTI-SYMPTOM COLD/FLU PO)   Oral   Take 30 mLs by mouth 2 (two) times daily as needed (flu-like symptoms).         . Guaifenesin 1200 MG TB12   Oral   Take 1 tablet (1,200 mg total) by mouth 2 (two) times daily.   20 each   0   . predniSONE (DELTASONE) 50 MG tablet   Oral   Take 1 tablet (50 mg total) by mouth daily.   5 tablet   0   . promethazine-dextromethorphan (  PROMETHAZINE-DM) 6.25-15 MG/5ML syrup   Oral   Take 5 mLs by mouth 4 (four) times daily as needed for cough.   120 mL   0    BP 134/76  Pulse 119  Temp(Src) 98.5 F (36.9 C) (Oral)  Resp 16  SpO2 98%  LMP 01/11/2013 Physical Exam  Nursing note and vitals reviewed. Constitutional: She is oriented to person, place, and time. She appears well-developed and well-nourished.  HENT:  Head: Normocephalic and atraumatic.  Mouth/Throat: Oropharynx is clear and moist.  Eyes: Pupils are equal, round, and reactive to light.  Neck: Normal range of motion. Neck supple.  Cardiovascular: Normal rate, regular rhythm and normal heart sounds.   No murmur heard. Pulmonary/Chest: Effort normal. She has wheezes. She has no rales.  Abdominal: Soft. Bowel sounds are normal. She exhibits no distension. There is no tenderness.   Lymphadenopathy:    She has cervical adenopathy.  Neurological: She is alert and oriented to person, place, and time.  Skin: Skin is warm and dry. No rash noted. No erythema.  Psychiatric: She has a normal mood and affect.    ED Course  Procedures (including critical care time) Labs Review Labs Reviewed  CBC WITH DIFFERENTIAL - Abnormal; Notable for the following:    WBC 19.4 (*)    RBC 5.25 (*)    Neutrophils Relative % 85 (*)    Neutro Abs 16.4 (*)    Lymphocytes Relative 11 (*)    All other components within normal limits  BASIC METABOLIC PANEL - Abnormal; Notable for the following:    Glucose, Bld 107 (*)    All other components within normal limits  URINALYSIS, ROUTINE W REFLEX MICROSCOPIC - Abnormal; Notable for the following:    APPearance CLOUDY (*)    All other components within normal limits  POCT PREGNANCY, URINE   Imaging Review Dg Chest 2 View  02/13/2013   CLINICAL DATA:  Cough with shortness of breath.  History of asthma.  EXAM: CHEST  2 VIEW  COMPARISON:  12/23/2012.  FINDINGS: The heart size and mediastinal contours are stable. The lungs demonstrate mild diffuse central airway thickening but no airspace disease or hyperinflation. There is no pleural effusion or pneumothorax.  IMPRESSION: Mild central airway thickening consistent with reactive airways disease or viral infection. No evidence of pneumonia or hyperinflation.   Electronically Signed   By: Roxy Horseman M.D.   On: 02/13/2013 19:32    Patient be treated for bronchitis and viral URI.  She still to return here as needed.  Told to increase her fluid intake, and rest as much as possible.  Patient voices an understanding of the plan and all questions were answered  MDM   1. Bronchitis       Carlyle Dolly, PA-C 02/14/13 [phone]

## 2013-02-13 NOTE — Discharge Instructions (Signed)
Return here as needed.  Followup with your primary care Dr. increase your fluid intake °

## 2013-02-14 NOTE — ED Provider Notes (Signed)
Medical screening examination/treatment/procedure(s) were performed by non-physician practitioner and as supervising physician I was immediately available for consultation/collaboration.    Samanyu Tinnell R Keylee Shrestha, MD 02/14/13 2254 

## 2013-03-04 ENCOUNTER — Encounter (HOSPITAL_COMMUNITY): Payer: Self-pay | Admitting: Emergency Medicine

## 2013-03-04 ENCOUNTER — Emergency Department (HOSPITAL_COMMUNITY)
Admission: EM | Admit: 2013-03-04 | Discharge: 2013-03-04 | Disposition: A | Discharge status: Home / Self Care | Payer: Self-pay | Attending: Emergency Medicine | Admitting: Emergency Medicine

## 2013-03-04 DIAGNOSIS — IMO0002 Reserved for concepts with insufficient information to code with codable children: Secondary | ICD-10-CM | POA: Insufficient documentation

## 2013-03-04 DIAGNOSIS — Z79899 Other long term (current) drug therapy: Secondary | ICD-10-CM | POA: Insufficient documentation

## 2013-03-04 DIAGNOSIS — J45901 Unspecified asthma with (acute) exacerbation: Secondary | ICD-10-CM | POA: Insufficient documentation

## 2013-03-04 MED ORDER — ALBUTEROL SULFATE HFA 108 (90 BASE) MCG/ACT IN AERS
2.0000 | INHALATION_SPRAY | RESPIRATORY_TRACT | Status: DC | PRN
  Administered 2013-03-04: 2 via RESPIRATORY_TRACT
  Filled 2013-03-04: qty 6.7

## 2013-03-04 MED ORDER — PREDNISONE 20 MG PO TABS: ORAL_TABLET | ORAL | Status: DC

## 2013-03-04 NOTE — ED Notes (Signed)
Pt from home reports that she ran out of her meds yesterday, woke up this am with wheezing. Pt denies cold s/sx, N/V/D, CP, SOB. Pt is A&O and in NAD

## 2013-03-04 NOTE — Discharge Instructions (Signed)
Asthma, Adult °Asthma is a recurring condition in which the airways tighten and narrow. Asthma can make it difficult to breathe. It can cause coughing, wheezing, and shortness of breath. Asthma episodes (also called asthma attacks) range from minor to life-threatening. Asthma cannot be cured, but medicines and lifestyle changes can help control it. °CAUSES °Asthma is believed to be caused by inherited (genetic) and environmental factors, but its exact cause is unknown. Asthma may be triggered by allergens, lung infections, or irritants in the air. Asthma triggers are different for each person. Common triggers include:  °· Animal dander. °· Dust mites. °· Cockroaches. °· Pollen from trees or grass. °· Mold. °· Smoke. °· Air pollutants such as dust, household cleaners, hair sprays, aerosol sprays, paint fumes, strong chemicals, or strong odors. °· Cold air, weather changes, and winds (which increase molds and pollens in the air). °· Strong emotional expressions such as crying or laughing hard. °· Stress. °· Certain medicines (such as aspirin) or types of drugs (such as beta-blockers). °· Sulfites in foods and drinks. Foods and drinks that may contain sulfites include dried fruit, potato chips, and sparkling grape juice. °· Infections or inflammatory conditions such as the flu, a cold, or an inflammation of the nasal membranes (rhinitis). °· Gastroesophageal reflux disease (GERD). °· Exercise or strenuous activity. °SYMPTOMS °Symptoms may occur immediately after asthma is triggered or many hours later. Symptoms include: °· Wheezing. °· Excessive nighttime or early morning coughing. °· Frequent or severe coughing with a common cold. °· Chest tightness. °· Shortness of breath. °DIAGNOSIS  °The diagnosis of asthma is made by a review of your medical history and a physical exam. Tests may also be performed. These may include: °· Lung function studies. These tests show how much air you breath in and out. °· Allergy  tests. °· Imaging tests such as X-rays. °TREATMENT  °Asthma cannot be cured, but it can usually be controlled. Treatment involves identifying and avoiding your asthma triggers. It also involves medicines. There are 2 classes of medicine used for asthma treatment:  °· Controller medicines. These prevent asthma symptoms from occurring. They are usually taken every day. °· Reliever or rescue medicines. These quickly relieve asthma symptoms. They are used as needed and provide short-term relief. °Your health care provider will help you create an asthma action plan. An asthma action plan is a written plan for managing and treating your asthma attacks. It includes a list of your asthma triggers and how they may be avoided. It also includes information on when medicines should be taken and when their dosage should be changed. An action plan may also involve the use of a device called a peak flow meter. A peak flow meter measures how well the lungs are working. It helps you monitor your condition. °HOME CARE INSTRUCTIONS  °· Take medicine as directed by your health care provider. Speak with your health care provider if you have questions about how or when to take the medicines. °· Use a peak flow meter as directed by your health care provider. Record and keep track of readings. °· Understand and use the action plan to help minimize or stop an asthma attack without needing to seek medical care. °· Control your home environment in the following ways to help prevent asthma attacks: °· Do not smoke. Avoid being exposed to secondhand smoke. °· Change your heating and air conditioning filter regularly. °· Limit your use of fireplaces and wood stoves. °· Get rid of pests (such as roaches and   mice) and their droppings. °· Throw away plants if you see mold on them. °· Clean your floors and dust regularly. Use unscented cleaning products. °· Try to have someone else vacuum for you regularly. Stay out of rooms while they are being  vacuumed and for a short while afterward. If you vacuum, use a dust mask from a hardware store, a double-layered or microfilter vacuum cleaner bag, or a vacuum cleaner with a HEPA filter. °· Replace carpet with wood, tile, or vinyl flooring. Carpet can trap dander and dust. °· Use allergy-proof pillows, mattress covers, and box spring covers. °· Wash bed sheets and blankets every week in hot water and dry them in a dryer. °· Use blankets that are made of polyester or cotton. °· Clean bathrooms and kitchens with bleach. If possible, have someone repaint the walls in these rooms with mold-resistant paint. Keep out of the rooms that are being cleaned and painted. °· Wash hands frequently. °SEEK MEDICAL CARE IF:  °· You have wheezing, shortness of breath, or a cough even if taking medicine to prevent attacks. °· The colored mucus you cough up (sputum) is thicker than usual. °· Your sputum changes from clear or white to yellow, green, gray, or bloody. °· You have any problems that may be related to the medicines you are taking (such as a rash, itching, swelling, or trouble breathing). °· You are using a reliever medicine more than 2 3 times per week. °· Your peak flow is still at 50 79% of you personal best after following your action plan for 1 hour. °SEEK IMMEDIATE MEDICAL CARE IF:  °· You seem to be getting worse and are unresponsive to treatment during an asthma attack. °· You are short of breath even at rest. °· You get short of breath when doing very little physical activity. °· You have difficulty eating, drinking, or talking due to asthma symptoms. °· You develop chest pain. °· You develop a fast heartbeat. °· You have a bluish color to your lips or fingernails. °· You are lightheaded, dizzy, or faint. °· Your peak flow is less than 50% of your personal best. °· You have a fever or persistent symptoms for more than 2 3 days. °· You have a fever and symptoms suddenly get worse. °MAKE SURE YOU:  °· Understand these  instructions. °· Will watch your condition. °· Will get help right away if you are not doing well or get worse. °Document Released: 12/28/2004 Document Revised: 08/30/2012 Document Reviewed: 07/27/2012 °ExitCare® Patient Information ©2014 [employer]. ° ° °Emergency Department Resource Guide °1) Find a Doctor and Pay Out of Pocket °Although you won't have to find out who is covered by your insurance plan, it is a good idea to ask around and get recommendations. You will then need to call the office and see if the doctor you have chosen will accept you as a new patient and what types of options they offer for patients who are self-pay. Some doctors offer discounts or will set up payment plans for their patients who do not have insurance, but you will need to ask so you aren't surprised when you get to your appointment. ° °2) Contact Your Local Health Department °Not all health departments have doctors that can see patients for sick visits, but many do, so it is worth a call to see if yours does. If you don't know where your local health department is, you can check in your phone book. The CDC also has a   tool to help you locate your state's health department, and many state websites also have listings of all of their local health departments.  3) Find a [facility] If your illness is not likely to be very severe or complicated, you may want to try a walk in clinic. These are popping up all over the country in pharmacies, drugstores, and shopping centers. They're usually staffed by nurse practitioners or physician assistants that have been trained to treat common illnesses and complaints. They're usually fairly quick and inexpensive. However, if you have serious medical issues or chronic medical problems, these are probably not your best option.  No Primary Care Doctor: - Call Health Connect at  [phone] - they can help you locate a primary care doctor that  accepts your insurance, provides certain services,  etc. - Physician Referral Service- [phone]  Chronic Pain Problems: Organization         Address  Phone   Notes  [facility]  [phone] Patients need to be referred by their primary care doctor.   Medication Assistance: Organization         Address  Phone   Notes  [facility] Medication [facility] [place], [place] [address] [phone] --Must be a resident of [facility] -- Must have NO insurance coverage whatsoever (no Medicaid/ Medicare, etc.) -- The pt. MUST have a primary care doctor that directs their care regularly and follows them in the community   [facility]  [phone]   [employer]  [phone]    Agencies that provide inexpensive medical care: Organization         Address  Phone   Notes  [facility]  [phone]   [facility]    [phone]   [facility] [place], [place] [address] [phone]   [place] [address], [place] [phone]   [facility]    [phone]   [facility]    [phone]   [facility] [address], [place] Phone:  [phone], Fax:  [phone] Hours of Operation:  9 am - 6 pm, M-F.  Also accepts Medicaid/Medicare and self-pay.  [facility] [place], [address], [place] Phone: [phone], Fax: [phone]. Hours of Operation:  8:30 am - 5:30 pm, M-F.  Also accepts Medicaid and self-pay.  [facility] [place] [address] [place] Phone: [phone]   [place], [place], [place] [phone], Ext. 123 Mondays & Thursdays: 7-9 AM.  First 15 patients are seen on a first come, first serve basis.    [place] Providers:  Organization         Address  Phone   Notes  [facility] [address], [place] [phone] Also accepts self-pay patients.  [facility] [id number] [place], [place]  [phone]   [place], [address], [place] [phone]   [facility] [address] [place] [phone]   Lucianne Lei [address], [place]   [phone] Only accepts [place] patients after they have their name applied to their card.   Self-Pay (no insurance) in [place]:  Organization         Address  Phone   Notes  °Sickle Cell Patients, [facility] [address], [place] ([phone]) [phone]   °[facility] [address], [place] ([phone]) [phone]   °[facility] [place] ° [address], [place] ([phone]) [phone]   °[facility]/Dr. Osei-Bonsu ° [address] [place] [address], [place] or [address], [place] ([phone]) [phone] Phone number for both [place] and [place] locations is the same.  °[facility] [address], [place] ([phone]) [phone]   °[facility] [address] [place] [address], [place] or [address] ([phone]) [phone] °(336) [phone]   °[facility] [address], [place] (336) [phone], phone; ([phone], fax Sees patients 1st and 3rd Saturday of every month.  Must not qualify for public or private insurance (i.e. Medicaid, Medicare, [place] Health Choice, Veterans' Benefits) • Household income should be no more than 200% of the poverty level •The clinic cannot treat you if you are pregnant or think you are pregnant • Sexually transmitted diseases are not treated at the clinic.  ° ° °Dental Care: °Organization         Address  Phone  Notes  °[facility] [address], [place] ([phone]) [phone] Accepts children up to  age 21 who are enrolled in Medicaid or [place] Health Choice; pregnant women with a Medicaid card; and children who have applied for Medicaid or [place] Health Choice, but were declined, whose parents can pay a reduced fee at time of service.  °[facility] [place]  [address], [place] (336) [phone] Accepts children up to age 21 who are enrolled in Medicaid or [place] Health Choice; pregnant women with a Medicaid card; and children who have applied for Medicaid or [place] Health Choice, but were declined, whose parents can pay a reduced fee at time of service.  °[facility] ° [address], [place] ([phone]) [phone] Patients are seen by appointment only. Walk-ins are not accepted. [facility] will see patients 18 years of age and older. °Monday - Tuesday (8am-5pm) °Most Wednesdays (8:30-5pm) °$30 per visit, cash only  °[facility] ° [address], [place] ([phone]) [phone] Patients are seen by appointment only. Walk-ins are not accepted. [facility] will see patients 18 years of age and older. °One Wednesday Evening (Monthly: Volunteer Based).  $30 per visit, cash only  °[facility]  ([phone]) [phone] for adults; Children under age 4, call Graduate Pediatric Dentistry at [phone]. Children aged 4-14, please call [phone] to request a pediatric application. ° Dental services are provided in all areas of dental care including fillings, crowns and bridges, complete and partial dentures, implants, gum treatment, root canals, and extractions. Preventive care is also provided. Treatment is provided to both adults and children. °Patients are selected via a lottery and there is often a waiting list. °  °[facility] [address], °[place] ° (336) 763-8833 [email or web address] °  °[facility] [address], [place], [place] (336)723-1848, Ext. 123 Second and Fourth Thursday of  each month, opens at 6:30 AM; Clinic ends at 9 AM.  Patients are seen on a first-come first-served basis, and a limited number are seen during each clinic.  ° °  [facility] ° [address], [place], [place] (336) [phone]   Eligibility Requirements °You must have lived in [place], [place], or [place] counties for at least the last three months. °  You cannot be eligible for state or federal sponsored healthcare insurance, including Veterans Administration, Medicaid, or Medicare. °  You generally cannot be eligible for healthcare insurance through your employer.  °  How to apply: °Eligibility screenings are held every Tuesday and Wednesday afternoon from 1:00 pm until 4:00 pm. You do not need an appointment for the interview!  °[facility] [address], [place], [place] [phone]   °[facility]  336-342-8273   °[facility]  336-703-3100   °[facility]  336-570-6415   ° °Behavioral Health Resources in the Community: °Intensive Outpatient Programs °Organization         Address  Phone  Notes  °[facility] [address], [place], [place] 336-878-6098   °[facility] [address], [place], [place] 336-832-9800   °[facility] [address], [place], [place] ° 336-882-2125   °[facility] [address],  °[place], [place] 1-800-853-5163 or 336-641-4981   °Substance Abuse Resources °Organization         Address  Phone  Notes  °[facility]  336-882-2125   °[facility]  336-784-9470   °[facility]  336-285-9073   °[facility]  336-845-3988   °Residential & Outpatient Substance Abuse Program  1-800-659-3381   °Psychological Services °Organization         Address  Phone  Notes  °[facility]  336- 832-9600   °[facility]  336- 378-7881   °[facility] [address],  [place] 1-800-853-5163 or 336-641-4981   ° °Mobile Crisis Teams °Organization         Address  Phone  Notes  °[facility], Mobile Crisis Care Unit  1-877-626-1772   °Assertive °[facility] ° [address] [place], [place] 336-834-9664   °Sharon DeEsch [address] °[place] 336-554-5454   ° °Self-Help/Support Groups °Organization         Address  Phone             Notes  °[facility] [place] - variety of support groups  336- 373-1402 Call for more information  °Narcotics Anonymous (NA), [facility] [address], °[place]  2 meetings at this location  ° °Residential Treatment Programs °Organization         Address  Phone  Notes  °[facility] [address],    °[place]  1-866-801-8205   °[facility] ° [address], [place], [place] 704-293-8524   °[facility] [address], [place] 336-845-3988 Admissions: 8am-3pm M-F  °[facility] [address]    °[place], [place] 336-841-1104   °[facility] [address], [place], [place] 336-379-7146   °[facility] [address]  °[place], [place] 336-285-9073   °[facility] - Intensive Outpatient [address], [place], [place] 336-852-3033   °[facility] [address]  °[place], [place] 1-877-615-2722 or 336-784-9470   °[facility] ([facility]) [address] [place], [place] 336-227-7417 Accepts Medicaid  °[facility] [address]  °[place]  1-800-659-3381 Substance Abuse/Addiction Treatment  ° °  [place] [facility] Resources °Organization         Address  Phone  Notes  °[facility]  (888) 581-9988   °Julie Brannon, PhD [address] [place], [place]   (336) 349-5553 or (336) 951-0000   °[facility]   [address] °[place], [place] (336) 349-4454     °[facility] [address], [place], [place] (336) 342-8316 Insurance/Medicaid/sponsorship through [facility]  °[facility] [address]                                    [place], [place] (336) 342-8316 Therapy/tele-psych/case  °[facility] [address]  ° [place], [place] (336) 349-2233    °Dr. Arfeen  (336) 349-4544   °[facility] [place] [facility]  [employer] [facility] 1) [address],  °2) [address], [place] °3)  [address], [place] (336) 349-3220 °(336) 342-7768 ° °(336) 342-8140   °[place] Child Abuse [facility] (336) 342-1394 or (336) 342-3537 (After Hours)    ° ° ° °

## 2013-03-04 NOTE — ED Notes (Signed)
Bed: WA09 Expected date:  Expected time:  Means of arrival:  Comments: EMS 

## 2013-03-04 NOTE — ED Provider Notes (Signed)
CSN: [id number]     Arrival date & time 03/04/13  1448 History   First MD Initiated Contact with Patient 03/04/13 1511     Chief Complaint  Patient presents with  . Asthma     (Consider location/radiation/quality/duration/timing/severity/associated sxs/prior Treatment) HPI Comments: Patient presents with asthma exacerbation. She does have a history of asthma and ran out of her inhaler this morning. She spoke up this morning with some shortness of breath and wheezing. She states she was doing fine yesterday. She has a dry cough but no chest congestion. She denies he fevers or chills. She denies any chest pain or tightness. She denies any leg swelling or calf tenderness. She was transferred by EMS and was given 125 of Solu-Medrol and an albuterol and Atrovent treatment in route. She also got an albuterol treatment here in the ED. When I saw her she was feeling completely better with no wheezing or shortness of breath.  Patient is a 23 y.o. female presenting with asthma.  Asthma Associated symptoms include shortness of breath. Pertinent negatives include no chest pain, no abdominal pain and no headaches.    Past Medical History  Diagnosis Date  . Asthma    Past Surgical History  Procedure Laterality Date  . No past surgeries     Family History  Problem Relation Age of Onset  . Asthma Father   . Autism Brother    History  Substance Use Topics  . Smoking status: Never Smoker   . Smokeless tobacco: Never Used  . Alcohol Use: No   OB History   Grav Para Term Preterm Abortions TAB SAB Ect Mult Living                 Review of Systems  Constitutional: Negative for fever, chills, diaphoresis and fatigue.  HENT: Negative for congestion, rhinorrhea and sneezing.   Eyes: Negative.   Respiratory: Positive for cough, shortness of breath and wheezing. Negative for chest tightness.   Cardiovascular: Negative for chest pain and leg swelling.  Gastrointestinal: Negative for nausea,  vomiting, abdominal pain, diarrhea and blood in stool.  Genitourinary: Negative for frequency, hematuria, flank pain and difficulty urinating.  Musculoskeletal: Negative for arthralgias and back pain.  Skin: Negative for rash.  Neurological: Negative for dizziness, speech difficulty, weakness, numbness and headaches.      Allergies  Peanut-containing drug products and Coconut flavor  Home Medications   Current Outpatient Rx  Name  Route  Sig  Dispense  Refill  . albuterol (PROVENTIL HFA;VENTOLIN HFA) 108 (90 BASE) MCG/ACT inhaler   Inhalation   Inhale 1-2 puffs into the lungs every 6 (six) hours as needed for wheezing.   2 Inhaler   1   . predniSONE (DELTASONE) 20 MG tablet      3 tabs po day one, then 2 po daily x 4 days   11 tablet   0    BP 119/63  Pulse 83  Temp(Src) 97.9 F (36.6 C) (Oral)  Resp 22  SpO2 100%  LMP 01/11/2013 Physical Exam  Constitutional: She is oriented to person, place, and time. She appears well-developed and well-nourished.  HENT:  Head: Normocephalic and atraumatic.  Eyes: Pupils are equal, round, and reactive to light.  Neck: Normal range of motion. Neck supple.  Cardiovascular: Normal rate, regular rhythm and normal heart sounds.   Pulmonary/Chest: Effort normal. No respiratory distress. She has wheezes ( scarce expiratory wheezes). She has no rales. She exhibits no tenderness.  Abdominal: Soft. Bowel sounds are  normal. There is no tenderness. There is no rebound and no guarding.  Musculoskeletal: Normal range of motion. She exhibits no edema.  Lymphadenopathy:    She has no cervical adenopathy.  Neurological: She is alert and oriented to person, place, and time.  Skin: Skin is warm and dry. No rash noted.  Psychiatric: She has a normal mood and affect.    ED Course  Procedures (including critical care time) Labs Review Labs Reviewed - No data to display Imaging Review No results found.  EKG Interpretation   None       MDM    Final diagnoses:  Asthma exacerbation    Patient presents with an asthma exacerbation. She has no increased work of breathing. Currently denies shortness of breath. She has normal oxygen saturations. She feels like she's ready to go home. She has no fevers chest congestion or other symptoms of pneumonia. She was discharged home in good condition was given outpatient resources for followup.    Rolan BuccoMelanie Akon Reinoso, MD 03/04/13 [phone]

## 2013-03-04 NOTE — ED Notes (Addendum)
Pt from home via EMS- per EMS pt has hx of asthma, ran out of inhaler. Pt started to have SOB and wheezing this am. Pt is A&O and in NAD. Pt has received 10 Albuterol, .5 atrovent, 125 solumedrol en route

## 2013-04-03 ENCOUNTER — Encounter (HOSPITAL_COMMUNITY): Payer: Self-pay | Admitting: Emergency Medicine

## 2013-04-03 ENCOUNTER — Emergency Department (HOSPITAL_COMMUNITY)
Admission: EM | Admit: 2013-04-03 | Discharge: 2013-04-03 | Disposition: A | Discharge status: Home / Self Care | Payer: Self-pay | Attending: Emergency Medicine | Admitting: Emergency Medicine

## 2013-04-03 DIAGNOSIS — Z79899 Other long term (current) drug therapy: Secondary | ICD-10-CM | POA: Insufficient documentation

## 2013-04-03 DIAGNOSIS — J45901 Unspecified asthma with (acute) exacerbation: Secondary | ICD-10-CM | POA: Insufficient documentation

## 2013-04-03 MED ORDER — ACETAMINOPHEN 325 MG PO TABS
650.0000 mg | ORAL_TABLET | Freq: Once | ORAL | Status: AC
  Administered 2013-04-03: 650 mg via ORAL
  Filled 2013-04-03: qty 2

## 2013-04-03 MED ORDER — PREDNISONE 20 MG PO TABS: 60.0000 mg | ORAL_TABLET | Freq: Every day | ORAL | Status: DC

## 2013-04-03 MED ORDER — ALBUTEROL SULFATE HFA 108 (90 BASE) MCG/ACT IN AERS
2.0000 | INHALATION_SPRAY | RESPIRATORY_TRACT | Status: DC | PRN
  Administered 2013-04-03: 2 via RESPIRATORY_TRACT
  Filled 2013-04-03: qty 6.7

## 2013-04-03 MED ORDER — ALBUTEROL SULFATE HFA 108 (90 BASE) MCG/ACT IN AERS: 2.0000 | INHALATION_SPRAY | RESPIRATORY_TRACT | Status: DC | PRN

## 2013-04-03 MED ORDER — METHYLPREDNISOLONE SODIUM SUCC 125 MG IJ SOLR
125.0000 mg | Freq: Once | INTRAMUSCULAR | Status: AC
  Administered 2013-04-03: 125 mg via INTRAVENOUS
  Filled 2013-04-03: qty 2

## 2013-04-03 NOTE — ED Notes (Signed)
Bed: [id number] Expected date: 04/03/13 Expected time:  Means of arrival:  Comments: EMS

## 2013-04-03 NOTE — Discharge Instructions (Signed)
Asthma Attack Prevention Although there is no way to prevent asthma from starting, you can take steps to control the disease and reduce its symptoms. Learn about your asthma and how to control it. Take an active role to control your asthma by working with your health care provider to create and follow an asthma action plan. An asthma action plan guides you in:  Taking your medicines properly.  Avoiding things that set off your asthma or make your asthma worse (asthma triggers).  Tracking your level of asthma control.  Responding to worsening asthma.  Seeking emergency care when needed. To track your asthma, keep records of your symptoms, check your peak flow number using a handheld device that shows how well air moves out of your lungs (peak flow meter), and get regular asthma checkups.  WHAT ARE SOME WAYS TO PREVENT AN ASTHMA ATTACK?  Take medicines as directed by your health care provider.  Keep track of your asthma symptoms and level of control.  With your health care provider, write a detailed plan for taking medicines and managing an asthma attack. Then be sure to follow your action plan. Asthma is an ongoing condition that needs regular monitoring and treatment.  Identify and avoid asthma triggers. Many outdoor allergens and irritants (such as pollen, mold, cold air, and air pollution) can trigger asthma attacks. Find out what your asthma triggers are and take steps to avoid them.  Monitor your breathing. Learn to recognize warning signs of an attack, such as coughing, wheezing, or shortness of breath. Your lung function may decrease before you notice any signs or symptoms, so regularly measure and record your peak airflow with a home peak flow meter.  Identify and treat attacks early. If you act quickly, you are less likely to have a severe attack. You will also need less medicine to control your symptoms. When your peak flow measurements decrease and alert you to an upcoming attack,  take your medicine as instructed and immediately stop any activity that may have triggered the attack. If your symptoms do not improve, get medical help.  Pay attention to increasing quick-relief inhaler use. If you find yourself relying on your quick-relief inhaler, your asthma is not under control. See your health care provider about adjusting your treatment. WHAT CAN MAKE MY SYMPTOMS WORSE? A number of common things can set off or make your asthma symptoms worse and cause temporary increased inflammation of your airways. Keep track of your asthma symptoms for several weeks, detailing all the environmental and emotional factors that are linked with your asthma. When you have an asthma attack, go back to your asthma diary to see which factor, or combination of factors, might have contributed to it. Once you know what these factors are, you can take steps to control many of them. If you have allergies and asthma, it is important to take asthma prevention steps at home. Minimizing contact with the substance to which you are allergic will help prevent an asthma attack. Some triggers and ways to avoid these triggers are: Animal Dander:  Some people are allergic to the flakes of skin or dried saliva from animals with fur or feathers.   There is no such thing as a hypoallergenic dog or cat breed. All dogs or cats can cause allergies, even if they don't shed.  Keep these pets out of your home.  If you are not able to keep a pet outdoors, keep the pet out of your bedroom and other sleeping areas at all  times, and keep the door closed.  Remove carpets and furniture covered with cloth from your home. If that is not possible, keep the pet away from fabric-covered furniture and carpets. Dust Mites: Many people with asthma are allergic to dust mites. Dust mites are tiny bugs that are found in every home in mattresses, pillows, carpets, fabric-covered furniture, bedcovers, clothes, stuffed toys, and other  fabric-covered items.   Cover your mattress in a special dust-proof cover.  Cover your pillow in a special dust-proof cover, or wash the pillow each week in hot water. Water must be hotter than 130 F (54.4 C) to kill dust mites. Cold or warm water used with detergent and bleach can also be effective.  Wash the sheets and blankets on your bed each week in hot water.  Try not to sleep or lie on cloth-covered cushions.  Call ahead when traveling and ask for a smoke-free hotel room. Bring your own bedding and pillows in case the hotel only supplies feather pillows and down comforters, which may contain dust mites and cause asthma symptoms.  Remove carpets from your bedroom and those laid on concrete, if you can.  Keep stuffed toys out of the bed, or wash the toys weekly in hot water or cooler water with detergent and bleach. Cockroaches: Many people with asthma are allergic to the droppings and remains of cockroaches.   Keep food and garbage in closed containers. Never leave food out.  Use poison baits, traps, powders, gels, or paste (for example, boric acid).  If a spray is used to kill cockroaches, stay out of the room until the odor goes away. Indoor Mold:  Fix leaky faucets, pipes, or other sources of water that have mold around them.  Clean floors and moldy surfaces with a fungicide or diluted bleach.  Avoid using humidifiers, vaporizers, or swamp coolers. These can spread molds through the air. Pollen and Outdoor Mold:  When pollen or mold spore counts are high, try to keep your windows closed.  Stay indoors with windows closed from late morning to afternoon. Pollen and some mold spore counts are highest at that time.  Ask your health care provider whether you need to take anti-inflammatory medicine or increase your dose of the medicine before your allergy season starts. Other Irritants to Avoid:  Tobacco smoke is an irritant. If you smoke, ask your health care provider how  you can quit. Ask family members to quit smoking too. Do not allow smoking in your home or car.  If possible, do not use a wood-burning stove, kerosene heater, or fireplace. Minimize exposure to all sources of smoke, including to incense, candles, fires, and fireworks.  Try to stay away from strong odors and sprays, such as perfume, talcum powder, hair spray, and paints.  Decrease humidity in your home and use an indoor air cleaning device. Reduce indoor humidity to below 60%. Dehumidifiers or central air conditioners can do this.  Decrease house dust exposure by changing furnace and air cooler filters frequently.  Try to have someone else vacuum for you once or twice a week. Stay out of rooms while they are being vacuumed and for a short while afterward.  If you vacuum, use a dust mask from a hardware store, a double-layered or microfilter vacuum cleaner bag, or a vacuum cleaner with a HEPA filter.  Sulfites in foods and beverages can be irritants. Do not drink beer or wine or eat dried fruit, processed potatoes, or shrimp if they cause asthma symptoms.  Cold air can trigger an asthma attack. Cover your nose and mouth with a scarf on cold or windy days. °· Several health conditions can make asthma more difficult to manage, including a runny nose, sinus infections, reflux disease, psychological stress, and sleep apnea. Work with your health care provider to manage these conditions. °· Avoid close contact with people who have a respiratory infection such as a cold or the flu, since your asthma symptoms may get worse if you catch the infection. Wash your hands thoroughly after touching items that may have been handled by people with a respiratory infection. °· Get a flu shot every year to protect against the flu virus, which often makes asthma worse for days or weeks. Also get a pneumonia shot if you have not previously had one. Unlike the flu shot, the pneumonia shot does not need to be given  yearly. °Medicines: °· Talk to your health care provider about whether it is safe for you to take aspirin or non-steroidal anti-inflammatory medicines (NSAIDs). In a small number of people with asthma, aspirin and NSAIDs can cause asthma attacks. These medicines must be avoided by people who have known aspirin-sensitive asthma. It is important that people with aspirin-sensitive asthma read labels of all over-the-counter medicines used to treat pain, colds, coughs, and fever. °· Beta blockers and ACE inhibitors are other medicines you should discuss with your health care provider. °HOW CAN I FIND OUT WHAT I AM ALLERGIC TO? °Ask your asthma health care provider about allergy skin testing or blood testing (the RAST test) to identify the allergens to which you are sensitive. If you are found to have allergies, the most important thing to do is to try to avoid exposure to any allergens that you are sensitive to as much as possible. Other treatments for allergies, such as medicines and allergy shots (immunotherapy) are available.  °CAN I EXERCISE? °Follow your health care provider's advice regarding asthma treatment before exercising. It is important to maintain a regular exercise program, but vigorous exercise, or exercise in cold, humid, or dry environments can cause asthma attacks, especially for those people who have exercise-induced asthma. °Document Released: 12/16/2008 Document Revised: 08/30/2012 Document Reviewed: 07/05/2012 °ExitCare® Patient Information ©2014 [employer] LLC. ° °Asthma, Acute Bronchospasm °Acute bronchospasm caused by asthma is also referred to as an asthma attack. Bronchospasm means your air passages become narrowed. The narrowing is caused by inflammation and tightening of the muscles in the air tubes (bronchi) in your lungs. This can make it hard to breath or cause you to wheeze and cough. °CAUSES °Possible triggers are: °· Animal dander from the skin, hair, or feathers of animals. °· Dust  mites contained in house dust. °· Cockroaches. °· Pollen from trees or grass. °· Mold. °· Cigarette or tobacco smoke. °· Air pollutants such as dust, household cleaners, hair sprays, aerosol sprays, paint fumes, strong chemicals, or strong odors. °· Cold air or weather changes. Cold air may trigger inflammation. Winds increase molds and pollens in the air. °· Strong emotions such as crying or laughing hard. °· Stress. °· Certain medicines such as aspirin or beta-blockers. °· Sulfites in foods and drinks, such as dried fruits and wine. °· Infections or inflammatory conditions, such as a flu, cold, or inflammation of the nasal membranes (rhinitis). °· Gastroesophageal reflux disease (GERD). GERD is a condition where stomach acid backs up into your throat (esophagus). °· Exercise or strenuous activity. °SIGNS AND SYMPTOMS  °· Wheezing. °· Excessive coughing, particularly at night. °· Chest tightness. °·   Shortness of breath. DIAGNOSIS  Your health care provider will ask you about your medical history and perform a physical exam. A chest X-ray or blood testing may be performed to look for other causes of your symptoms or other conditions that may have triggered your asthma attack. TREATMENT  Treatment is aimed at reducing inflammation and opening up the airways in your lungs. Most asthma attacks are treated with inhaled medicines. These include quick relief or rescue medicines (such as bronchodilators) and controller medicines (such as inhaled corticosteroids). These medicines are sometimes given through an inhaler or a nebulizer. Systemic steroid medicine taken by mouth or given through an IV tube also can be used to reduce the inflammation when an attack is moderate or severe. Antibiotic medicines are only used if a bacterial infection is present.  HOME CARE INSTRUCTIONS   Rest.  Drink plenty of liquids. This helps the mucus to remain thin and be easily coughed up. Only use caffeine in moderation and do not  use alcohol until you have recovered from your illness.  Do not smoke. Avoid being exposed to secondhand smoke.  You play a critical role in keeping yourself in good health. Avoid exposure to things that cause you to wheeze or to have breathing problems.  Keep your medicines up to date and available. Carefully follow your health care provider's treatment plan.  Take your medicine exactly as prescribed.  When pollen or pollution is bad, keep windows closed and use an air conditioner or go to places with air conditioning.  Asthma requires careful medical care. See your health care provider for a follow-up as advised. If you are more than [redacted] weeks pregnant and you were prescribed any new medicines, let your obstetrician know about the visit and how you are doing. Follow-up with your health care provider as directed.  After you have recovered from your asthma attack, make an appointment with your outpatient doctor to talk about ways to reduce the likelihood of future attacks. If you do not have a doctor who manages your asthma, make an appointment with a primary care doctor to discuss your asthma. SEEK IMMEDIATE MEDICAL CARE IF:   You are getting worse.  You have trouble breathing. If severe, call your local emergency services (911 in the [place]  You develop chest pain or discomfort.  You are vomiting.  You are not able to keep fluids down.  You are coughing up yellow, green, brown, or bloody sputum.  You have a fever and your symptoms suddenly get worse.  You have trouble swallowing. MAKE SURE YOU:   Understand these instructions.  Will watch your condition.  Will get help right away if you are not doing well or get worse. Document Released: 04/14/2006 Document Revised: 08/30/2012 Document Reviewed: 07/05/2012 [facility] Patient Information 2014 [employer], [place].    Emergency Department Resource Guide 1) Find a Doctor and Pay Out of Pocket Although you won't have to find out  who is covered by your insurance plan, it is a good idea to ask around and get recommendations. You will then need to call the office and see if the doctor you have chosen will accept you as a new patient and what types of options they offer for patients who are self-pay. Some doctors offer discounts or will set up payment plans for their patients who do not have insurance, but you will need to ask so you aren't surprised when you get to your appointment.  2) Contact Your Local Health Department Not all  health departments have doctors that can see patients for sick visits, but many do, so it is worth a call to see if yours does. If you don't know where your local health department is, you can check in your phone book. The CDC also has a tool to help you locate your state's health department, and many state websites also have listings of all of their local health departments.  3) Find a Walk-in Clinic If your illness is not likely to be very severe or complicated, you may want to try a walk in clinic. These are popping up all over the country in pharmacies, drugstores, and shopping centers. They're usually staffed by nurse practitioners or physician assistants that have been trained to treat common illnesses and complaints. They're usually fairly quick and inexpensive. However, if you have serious medical issues or chronic medical problems, these are probably not your best option.  No Primary Care Doctor: - Call Health Connect at  [phone] - they can help you locate a primary care doctor that  accepts your insurance, provides certain services, etc. - Physician Referral Service- [phone]  Chronic Pain Problems: Organization         Address  Phone   Notes  [facility]  [phone] Patients need to be referred by their primary care doctor.   Medication Assistance: Organization         Address  Phone   Notes  [facility] Medication [facility] [address]  [place] [place], [place] [address] [phone] --Must be a resident of [facility] -- Must have NO insurance coverage whatsoever (no Medicaid/ Medicare, etc.) -- The pt. MUST have a primary care doctor that directs their care regularly and follows them in the community   [facility]  [phone]   [employer]  [phone]    Agencies that provide inexpensive medical care: Organization         Address  Phone   Notes  [facility]  [phone]   [facility]    [phone]   [facility] [address] [place], [place] [address] [phone]   [facility] [address] [place] [phone]   Planned Parenthood    [phone]   [facility]    [phone]   [facility]  [address], [place] Phone:  [phone], Fax:  [phone] Hours of Operation:  9 am - 6 pm, M-F.  Also accepts Medicaid/Medicare and self-pay.  [facility]  [address], [place] Phone: [phone], Fax: [phone]. Hours of Operation:  8:30 am - 5:30 pm, M-F.  Also accepts Medicaid and self-pay.  [facility] [place] [address] [place] Phone: [phone]   [facility] [address] Natasha Bence [place], [place] [phone], Ext. [phone] Mondays & Thursdays: 7-9 AM.  First 15 patients are seen on a first come, first serve basis.    Medicaid-accepting [facility] Providers:  Organization         Address  Phone   Notes  [facility] [address], [place] [phone] Also accepts self-pay patients.  [facility] [address] Laurell Josephs [place], [place]  [phone]   [facility] [address], [place] [phone]   [facility] [address] [place] [phone]   Renaye Rakers  [address], [place]   [phone] Only accepts [place] patients after they have their name applied to their card.   Self-Pay (no insurance) in [facility]:  Organization         Address  Phone   Notes  Sickle Cell Patients, [facility] [address] [place], [place] [phone]   [facility] [address] [place], [place] [phone]   [facility]  [address], [place] [phone]   [facility]/Dr. Osei-Bonsu  [address] [place] or [address], [place] [phone] Phone number for both [place] and [place] locations is the same.  [facility] [address] [place] [phone]   [facility] [address] [place] or [address] [phone]   [facility] [address], [place] [phone], phone; [phone], fax Sees patients 1st and 3rd Saturday of every month.  Must not qualify for public or private insurance (i.e. Medicaid, Medicare, [place] Health Choice, Veterans' Benefits)  Household income should be no more than 200% of the poverty level The clinic cannot treat you if you are pregnant or think you are pregnant  Sexually transmitted diseases are not treated at the clinic.    Dental Care: Organization         Address  Phone  Notes  [facility] [address] [place], [place] [phone] Accepts children up to age 51 who are enrolled in [place] or [place] Health Choice; pregnant women with a Medicaid card; and children who have applied for Medicaid or [place] Health Choice, but were declined, whose parents can pay a reduced fee at time of service.  [facility] Department of [facility]  [address], [place] [phone] Accepts children up to age 72  who are enrolled in [place] or [place] Health Choice; pregnant women with a Medicaid card; and children who have applied for Medicaid or [place] Health Choice, but were declined, whose parents can pay a reduced fee at time of service.  [facility]  [address] [place], [place] [phone] Patients are seen by appointment only. Walk-ins are not accepted. [facility] will see patients 19 years of age and older. Monday - Tuesday (8am-5pm) Most Wednesdays (8:30-5pm) $30 per visit, cash only  [facility] Adult Dental Access PRO[facility]  [address], [facility] [phone] Patients are seen by appointment only. Walk-ins are not accepted. [facility] will see patients 97 years of age and older. One Wednesday Evening (Monthly: Volunteer Based).  $30 per visit, cash only  [employer]  [phone] for adults; Children under age 73, call Graduate Pediatric Dentistry at [phone]. Children aged 48-14, please call [phone] to request a pediatric application.  Dental services are provided in all areas of dental care including fillings, crowns and bridges, complete and partial dentures, implants, gum treatment, root canals, and extractions. Preventive care is also provided. Treatment is provided to both adults and children. Patients are selected via a lottery and there is often a waiting list.   [facility] [address] [place]  [phone] [email or web address]   [facility] [address], [place], [place] [phone], Ext. [phone] Second and Fourth Thursday of each month, opens at 6:30 AM; Clinic ends at 9 AM.  Patients are seen on a first-come first-served basis, and a limited number are seen during each clinic.   [facility]  [address] Ether Griffins [place], [place] [phone]   Eligibility Requirements You must have lived in [place], [place], or [place] counties for at least the last three months.    You cannot be eligible for state or federal sponsored [place], including [employer], [place], or [employer].   You generally cannot be eligible for healthcare insurance through your employer.    How to apply: Eligibility screenings are held every Tuesday and Wednesday afternoon from 1:00 pm until 4:00 pm. You do not need an appointment for the interview!  [facility] [address], [place], [place] [phone]   [facility]  [phone]   [facility]  [phone]   [facility]  [phone]    Behavioral Health Resources in the Community: Intensive Outpatient Programs Organization         Address  Phone  Notes  [facility] [address], [place], [place] [phone]   [facility] [address], [place] [phone]   [facility] [address] [place], [place]  [phone]   [facility] Mental Health [address]  [place], [place] [phone] or [phone]   Substance Abuse Resources Organization         Address  Phone  Notes  [facility]  [phone]   [facility]  [phone]   [facility] [place]  [phone]   Floydene Flock  [phone]   Residential & Outpatient Substance Abuse Program  [phone]   Psychological Services Organization         Address  Phone  Notes  [facility]  [phone]   [facility]  [phone]   [facility] [address] [place] [phone] or [phone]    Mobile Crisis Teams Organization         Address  Phone  Notes  [facility], Mobile Crisis Care Unit  [phone]   [facility]  [address] [place], [place] [phone]   Doristine Locks [address] [place] [phone]    Self-Help/Support  Groups Organization         Address  Phone             Notes  Mental Health [facility] - variety of support groups  [phone] Call for more information  Narcotics Anonymous (NA), [facility] [address], [employer] Anton Ruiz  2 meetings at this location   Statistician         Address  Phone  Notes  [facility] [address],    [place]  [phone]   [facility]  [address] [place] [address], [place], [place] [phone]   [facility] [address] [place], [place] [phone] Admissions: 8am-3pm M-F  [facility] [address],    [place], [place] [phone]   [facility] [address] Starling Manns [place], [place] [phone]   [facility] [address]  [place], [place] [phone]   [facility] Int[facility]patient [phone] Laurell Josephs  [address], [place], [place] [phone]   [facility] [address] [place]  [place], [place] [phone] or [phone]   [facility] [address], [place], [place] [phone] Accepts Medicaid  [facility] [address],  [place] [phone] Substance Abuse/Addiction Treatment   [facility]         Address  Phone  Notes  [facility]  [phone]   Angie Fava, PhD [address] Ervin Knack [place], [place]   [phone] or [phone]   [facility]   [address] [place], [place] [phone]   [facility] [address], [place], [place] [phone] Insurance/Medicaid/sponsorship through [facility] Families [address]                                    [place], [place] [phone] Therapy/tele-psych/case  [facility] [address], [place] [phone]    Dr. Lolly Mustache  [phone]   [facility]  [employer] [facility] Dept. 1) [address], [place] 2) [address] [place] 3)  [address], [place] [phone]  [phone]   [facility] Child Abuse Hotline 3074030120 or [phone] (After Hours)

## 2013-04-03 NOTE — ED Notes (Signed)
Per EMS, pt from home. Pt has asthma and does not have inhaler.  Pt is wheezing in route.  Pt given 10mg  albuterol and .5 mg atrovent.  Pt has 22g left hand.  Vitals:  122/78, hr 108, resp 18, 100% on neb at 8 liters.

## 2013-04-03 NOTE — ED Provider Notes (Signed)
TIME SEEN: 9:25 AM  CHIEF COMPLAINT: Asthma exacerbation  HPI: Patient is a 23 year old female with a history of asthma who has run out of her albuterol inhaler who presents to the emergency department with an asthma exacerbation that started at 1 AM this morning. She was given 10 mg of albuterol, 0.5 mg of Atrovent by EMS and states she now feels completely better. She denies any fevers but states she has had a productive cough with green sputum. She's had pneumonia in the past he states this does not feel like that. She denies any chest pain. Denies a history of PE or DVT. No lower extremity swelling or pain. She states she is completely back to her baseline.  ROS: See HPI Constitutional: no fever  Eyes: no drainage  ENT: no runny nose   Cardiovascular:  no chest pain  Resp:  SOB  GI: no vomiting GU: no dysuria Integumentary: no rash  Allergy: no hives  Musculoskeletal: no leg swelling  Neurological: no slurred speech ROS otherwise negative  PAST MEDICAL HISTORY/PAST SURGICAL HISTORY:  Past Medical History  Diagnosis Date  . Asthma     MEDICATIONS:  Prior to Admission medications   Medication Sig Start Date End Date Taking? Authorizing Provider  albuterol (PROVENTIL HFA;VENTOLIN HFA) 108 (90 BASE) MCG/ACT inhaler Inhale 1-2 puffs into the lungs every 6 (six) hours as needed for wheezing. 12/25/12  Yes Rhetta MuraJai-Gurmukh Samtani, MD    ALLERGIES:  Allergies  Allergen Reactions  . Peanut-Containing Drug Products Anaphylaxis  . Coconut Flavor Swelling    SOCIAL HISTORY:  History  Substance Use Topics  . Smoking status: Never Smoker   . Smokeless tobacco: Never Used  . Alcohol Use: No    FAMILY HISTORY: Family History  Problem Relation Age of Onset  . Asthma Father   . Autism Brother     EXAM: BP 121/63  Pulse 112  Temp(Src) 97.8 F (36.6 C) (Oral)  Resp 16  SpO2 99% CONSTITUTIONAL: Alert and oriented and responds appropriately to questions. Well-appearing;  well-nourished, in no apparent distress, nontoxic HEAD: Normocephalic EYES: Conjunctivae clear, PERRL ENT: normal nose; no rhinorrhea; moist mucous membranes; pharynx without lesions noted NECK: Supple, no meningismus, no LAD  CARD: RRR; S1 and S2 appreciated; no murmurs, no clicks, no rubs, no gallops RESP: Normal chest excursion without splinting or tachypnea; breath sounds clear and equal bilaterally; no wheezes, no rhonchi, no rales, no respiratory distress, no increased work of breathing, patient speaking in full sentences ABD/GI: Normal bowel sounds; non-distended; soft, non-tender, no rebound, no guarding BACK:  The back appears normal and is non-tender to palpation, there is no CVA tenderness EXT: Normal ROM in all joints; non-tender to palpation; no edema; normal capillary refill; no cyanosis    SKIN: Normal color for age and race; warm NEURO: Moves all extremities equally PSYCH: The patient's mood and manner are appropriate. Grooming and personal hygiene are appropriate.  MEDICAL DECISION MAKING: Patient here with asthma exacerbation that was resolved with breathing treatment provided by EMS. She is hemodynamically stable. No hypoxia or respiratory distress. We'll give steroids and discharged home with prescription for prednisone and refill her albuterol inhaler. We'll also give PCP followup information. Given her lungs are clear and she is afebrile and states she has no current complaints, do not feel she needs a chest x-ray at this time. I am not concerned for pneumothorax, pulmonary embolus, infiltrate. Patient comfortable with this plan, verbalizes understanding.       Layla MawKristen N Emmette Katt, DO  04/03/13 0928 

## 2013-04-18 ENCOUNTER — Encounter (HOSPITAL_COMMUNITY): Payer: Self-pay | Admitting: Emergency Medicine

## 2013-04-18 ENCOUNTER — Emergency Department (HOSPITAL_COMMUNITY)
Admission: EM | Admit: 2013-04-18 | Discharge: 2013-04-18 | Discharge status: Against Medical Advice | Payer: Self-pay | Attending: Emergency Medicine | Admitting: Emergency Medicine

## 2013-04-18 DIAGNOSIS — L02419 Cutaneous abscess of limb, unspecified: Secondary | ICD-10-CM | POA: Insufficient documentation

## 2013-04-18 DIAGNOSIS — L03119 Cellulitis of unspecified part of limb: Principal | ICD-10-CM

## 2013-04-18 DIAGNOSIS — J45909 Unspecified asthma, uncomplicated: Secondary | ICD-10-CM | POA: Insufficient documentation

## 2013-04-18 NOTE — ED Notes (Signed)
No answer in WR

## 2013-04-18 NOTE — ED Notes (Signed)
No answer in waiting room 

## 2013-04-18 NOTE — ED Notes (Signed)
Pt c/o abscess to upper inner thigh x 1 wk.  Also thinks she may be pregnant.  LMP 2 months ago.

## 2013-04-20 ENCOUNTER — Emergency Department (HOSPITAL_COMMUNITY)
Admission: EM | Admit: 2013-04-20 | Discharge: 2013-04-20 | Disposition: A | Discharge status: Home / Self Care | Payer: Self-pay | Attending: Emergency Medicine | Admitting: Emergency Medicine

## 2013-04-20 ENCOUNTER — Encounter (HOSPITAL_COMMUNITY): Payer: Self-pay | Admitting: Emergency Medicine

## 2013-04-20 DIAGNOSIS — A5900 Urogenital trichomoniasis, unspecified: Secondary | ICD-10-CM | POA: Insufficient documentation

## 2013-04-20 DIAGNOSIS — R059 Cough, unspecified: Secondary | ICD-10-CM | POA: Insufficient documentation

## 2013-04-20 DIAGNOSIS — Z3202 Encounter for pregnancy test, result negative: Secondary | ICD-10-CM | POA: Insufficient documentation

## 2013-04-20 DIAGNOSIS — N898 Other specified noninflammatory disorders of vagina: Secondary | ICD-10-CM | POA: Insufficient documentation

## 2013-04-20 DIAGNOSIS — J45909 Unspecified asthma, uncomplicated: Secondary | ICD-10-CM | POA: Insufficient documentation

## 2013-04-20 DIAGNOSIS — Z79899 Other long term (current) drug therapy: Secondary | ICD-10-CM | POA: Insufficient documentation

## 2013-04-20 DIAGNOSIS — A5901 Trichomonal vulvovaginitis: Secondary | ICD-10-CM

## 2013-04-20 DIAGNOSIS — R05 Cough: Secondary | ICD-10-CM | POA: Insufficient documentation

## 2013-04-20 LAB — URINE MICROSCOPIC-ADD ON

## 2013-04-20 LAB — WET PREP, GENITAL
Clue Cells Wet Prep HPF POC: NONE SEEN
Yeast Wet Prep HPF POC: NONE SEEN

## 2013-04-20 LAB — URINALYSIS, ROUTINE W REFLEX MICROSCOPIC
Bilirubin Urine: NEGATIVE
Glucose, UA: NEGATIVE mg/dL
Hgb urine dipstick: NEGATIVE
Ketones, ur: NEGATIVE mg/dL
NITRITE: NEGATIVE
PROTEIN: NEGATIVE mg/dL
Specific Gravity, Urine: 1.013 (ref 1.005–1.030)
Urobilinogen, UA: 0.2 mg/dL (ref 0.0–1.0)
pH: 5.5 (ref 5.0–8.0)

## 2013-04-20 LAB — POC URINE PREG, ED: Preg Test, Ur: NEGATIVE

## 2013-04-20 LAB — GC/CHLAMYDIA PROBE AMP
CT Probe RNA: NEGATIVE
GC Probe RNA: POSITIVE — AB

## 2013-04-20 MED ORDER — CEFIXIME 400 MG PO TABS: 400.0000 mg | ORAL_TABLET | Freq: Every day | ORAL | Status: DC

## 2013-04-20 MED ORDER — ALBUTEROL SULFATE HFA 108 (90 BASE) MCG/ACT IN AERS
2.0000 | INHALATION_SPRAY | RESPIRATORY_TRACT | Status: DC | PRN
  Administered 2013-04-20: 2 via RESPIRATORY_TRACT
  Filled 2013-04-20: qty 6.7

## 2013-04-20 MED ORDER — METRONIDAZOLE 500 MG PO TABS
2000.0000 mg | ORAL_TABLET | Freq: Once | ORAL | Status: AC
  Administered 2013-04-20: 2000 mg via ORAL
  Filled 2013-04-20: qty 4

## 2013-04-20 MED ORDER — AZITHROMYCIN 250 MG PO TABS: 1000.0000 mg | ORAL_TABLET | Freq: Once | ORAL | Status: DC

## 2013-04-20 MED ORDER — DOXYCYCLINE HYCLATE 100 MG PO CAPS: 100.0000 mg | ORAL_CAPSULE | Freq: Two times a day (BID) | ORAL | Status: DC

## 2013-04-20 NOTE — ED Notes (Signed)
Bed: [id number] Expected date:  Expected time:  Means of arrival:  Comments: EMS/22 yo wheezing/nebs

## 2013-04-20 NOTE — Discharge Instructions (Signed)
You were found to have a Trichomonas infection. This is a sexually transmitted disease. Inform all of your partners of your infection so they may be treated also. You also have gonorrhea and chlamydia test pending. Please check for your results in one to 2 days and receive treatment if you have any positive results. Followup with a primary care provider for continued evaluation and treatment    Asthma, Adult Asthma is a recurring condition in which the airways tighten and narrow. Asthma can make it difficult to breathe. It can cause coughing, wheezing, and shortness of breath. Asthma episodes (also called asthma attacks) range from minor to life-threatening. Asthma cannot be cured, but medicines and lifestyle changes can help control it. CAUSES Asthma is believed to be caused by inherited (genetic) and environmental factors, but its exact cause is unknown. Asthma may be triggered by allergens, lung infections, or irritants in the air. Asthma triggers are different for each person. Common triggers include:   Animal dander.  Dust mites.  Cockroaches.  Pollen from trees or grass.  Mold.  Smoke.  Air pollutants such as dust, household cleaners, hair sprays, aerosol sprays, paint fumes, strong chemicals, or strong odors.  Cold air, weather changes, and winds (which increase molds and pollens in the air).  Strong emotional expressions such as crying or laughing hard.  Stress.  Certain medicines (such as aspirin) or types of drugs (such as beta-blockers).  Sulfites in foods and drinks. Foods and drinks that may contain sulfites include dried fruit, potato chips, and sparkling grape juice.  Infections or inflammatory conditions such as the flu, a cold, or an inflammation of the nasal membranes (rhinitis).  Gastroesophageal reflux disease (GERD).  Exercise or strenuous activity. SYMPTOMS Symptoms may occur immediately after asthma is triggered or many hours later. Symptoms  include:  Wheezing.  Excessive nighttime or early morning coughing.  Frequent or severe coughing with a common cold.  Chest tightness.  Shortness of breath. DIAGNOSIS  The diagnosis of asthma is made by a review of your medical history and a physical exam. Tests may also be performed. These may include:  Lung function studies. These tests show how much air you breath in and out.  Allergy tests.  Imaging tests such as X-rays. TREATMENT  Asthma cannot be cured, but it can usually be controlled. Treatment involves identifying and avoiding your asthma triggers. It also involves medicines. There are 2 classes of medicine used for asthma treatment:   Controller medicines. These prevent asthma symptoms from occurring. They are usually taken every day.  Reliever or rescue medicines. These quickly relieve asthma symptoms. They are used as needed and provide short-term relief. Your health care provider will help you create an asthma action plan. An asthma action plan is a written plan for managing and treating your asthma attacks. It includes a list of your asthma triggers and how they may be avoided. It also includes information on when medicines should be taken and when their dosage should be changed. An action plan may also involve the use of a device called a peak flow meter. A peak flow meter measures how well the lungs are working. It helps you monitor your condition. HOME CARE INSTRUCTIONS   Take medicine as directed by your health care provider. Speak with your health care provider if you have questions about how or when to take the medicines.  Use a peak flow meter as directed by your health care provider. Record and keep track of readings.  Understand and  use the action plan to help minimize or stop an asthma attack without needing to seek medical care.  Control your home environment in the following ways to help prevent asthma attacks:  Do not smoke. Avoid being exposed to  secondhand smoke.  Change your heating and air conditioning filter regularly.  Limit your use of fireplaces and wood stoves.  Get rid of pests (such as roaches and mice) and their droppings.  Throw away plants if you see mold on them.  Clean your floors and dust regularly. Use unscented cleaning products.  Try to have someone else vacuum for you regularly. Stay out of rooms while they are being vacuumed and for a short while afterward. If you vacuum, use a dust mask from a hardware store, a double-layered or microfilter vacuum cleaner bag, or a vacuum cleaner with a HEPA filter.  Replace carpet with wood, tile, or vinyl flooring. Carpet can trap dander and dust.  Use allergy-proof pillows, mattress covers, and box spring covers.  Wash bed sheets and blankets every week in hot water and dry them in a dryer.  Use blankets that are made of polyester or cotton.  Clean bathrooms and kitchens with bleach. If possible, have someone repaint the walls in these rooms with mold-resistant paint. Keep out of the rooms that are being cleaned and painted.  Wash hands frequently. SEEK MEDICAL CARE IF:   You have wheezing, shortness of breath, or a cough even if taking medicine to prevent attacks.  The colored mucus you cough up (sputum) is thicker than usual.  Your sputum changes from clear or white to yellow, green, gray, or bloody.  You have any problems that may be related to the medicines you are taking (such as a rash, itching, swelling, or trouble breathing).  You are using a reliever medicine more than 2 3 times per week.  Your peak flow is still at 50 79% of you personal best after following your action plan for 1 hour. SEEK IMMEDIATE MEDICAL CARE IF:   You seem to be getting worse and are unresponsive to treatment during an asthma attack.  You are short of breath even at rest.  You get short of breath when doing very little physical activity.  You have difficulty eating,  drinking, or talking due to asthma symptoms.  You develop chest pain.  You develop a fast heartbeat.  You have a bluish color to your lips or fingernails.  You are lightheaded, dizzy, or faint.  Your peak flow is less than 50% of your personal best.  You have a fever or persistent symptoms for more than 2 3 days.  You have a fever and symptoms suddenly get worse. MAKE SURE YOU:   Understand these instructions.  Will watch your condition.  Will get help right away if you are not doing well or get worse. Document Released: 12/28/2004 Document Revised: 08/30/2012 Document Reviewed: 07/27/2012 [facility] Patient Information 2014 [place], [place].    Trichomoniasis Trichomoniasis is an infection, caused by the Trichomonas organism, that affects both women and men. In women, the outer female genitalia and the vagina are affected. In men, the penis is mainly affected, but the prostate and other reproductive organs can also be involved. Trichomoniasis is a sexually transmitted disease (STD) and is most often passed to another person through sexual contact. The majority of people who get trichomoniasis do so from a sexual encounter and are also at risk for other STDs. CAUSES   Sexual intercourse with an infected partner.  It can be present in swimming pools or hot tubs. SYMPTOMS   Abnormal gray-green frothy vaginal discharge in women.  Vaginal itching and irritation in women.  Itching and irritation of the area outside the vagina in women.  Penile discharge with or without pain in males.  Inflammation of the urethra (urethritis), causing painful urination.  Bleeding after sexual intercourse. RELATED COMPLICATIONS  Pelvic inflammatory disease.  Infection of the uterus (endometritis).  Infertility.  Tubal (ectopic) pregnancy.  It can be associated with other STDs, including gonorrhea and chlamydia, hepatitis B, and HIV. COMPLICATIONS DURING PREGNANCY  Early (premature)  delivery.  Premature rupture of the membranes (PROM).  Low birth weight. DIAGNOSIS   Visualization of Trichomonas under the microscope from the vagina discharge.  Ph of the vagina greater than 4.5, tested with a test tape.  Trich Rapid Test.  Culture of the organism, but this is not usually needed.  It may be found on a Pap test.  Having a "strawberry cervix,"which means the cervix looks very red like a strawberry. TREATMENT   You may be given medication to fight the infection. Inform your caregiver if you could be or are pregnant. Some medications used to treat the infection should not be taken during pregnancy.  Over-the-counter medications or creams to decrease itching or irritation may be recommended.  Your sexual partner will need to be treated if infected. HOME CARE INSTRUCTIONS   Take all medication prescribed by your caregiver.  Take over-the-counter medication for itching or irritation as directed by your caregiver.  Do not have sexual intercourse while you have the infection.  Do not douche or wear tampons.  Discuss your infection with your partner, as your partner may have acquired the infection from you. Or, your partner may have been the person who transmitted the infection to you.  Have your sex partner examined and treated if necessary.  Practice safe, informed, and protected sex.  See your caregiver for other STD testing. SEEK MEDICAL CARE IF:   You still have symptoms after you finish the medication.  You have an oral temperature above 102 F (38.9 C).  You develop belly (abdominal) pain.  You have pain when you urinate.  You have bleeding after sexual intercourse.  You develop a rash.  The medication makes you sick or makes you throw up (vomit). Document Released: 06/23/2000 Document Revised: 03/22/2011 Document Reviewed: 07/19/2008 [facility] Patient Information 2014 [place], [place].

## 2013-04-20 NOTE — ED Notes (Signed)
POCT PREG resulted NEg.

## 2013-04-20 NOTE — ED Provider Notes (Signed)
CSN: [id number]     Arrival date & time 04/20/13  0413 History   First MD Initiated Contact with Patient 04/20/13 [phone]     Chief Complaint  Patient presents with  . Asthma  . Vaginal Discharge   HPI  History provided by the patient. Patient is a 23 year old female with history of asthma and eczema who presents with symptoms of increased shortness of breath and wheezing as well as complaints of vaginal discharge. Patient reports having some increasing asthma and wheezing symptoms through the day yesterday. She does not have any albuterol inhalers at home and called 911 due to her shortness of breath. She was given albuterol by EMS and now reports feeling significantly improved. She also complains of a clear vaginal discharge for the past week. She states she has been having increased discharge into her clothing. Denies any bleeding. She also reports some irregularities with her menstrual cycle without normal. For the past 2 months. She is sexually active does not use protection every time. Denies past history of STD. No associated fever, chills or sweats. No abdominal pain. No urinary complaints.    Past Medical History  Diagnosis Date  . Asthma    Past Surgical History  Procedure Laterality Date  . No past surgeries     Family History  Problem Relation Age of Onset  . Asthma Father   . Autism Brother    History  Substance Use Topics  . Smoking status: Never Smoker   . Smokeless tobacco: Never Used  . Alcohol Use: No   OB History   Grav Para Term Preterm Abortions TAB SAB Ect Mult Living                 Review of Systems  Constitutional: Negative for fever, chills and diaphoresis.  HENT: Negative for congestion.   Respiratory: Positive for cough, shortness of breath and wheezing.   Gastrointestinal: Negative for nausea, vomiting, abdominal pain, diarrhea and constipation.  Genitourinary: Positive for vaginal discharge. Negative for dysuria, frequency, hematuria, flank pain  and vaginal bleeding.  All other systems reviewed and are negative.     Allergies  Peanut-containing drug products and Coconut flavor  Home Medications   Current Outpatient Rx  Name  Route  Sig  Dispense  Refill  . albuterol (PROVENTIL HFA;VENTOLIN HFA) 108 (90 BASE) MCG/ACT inhaler   Inhalation   Inhale 2 puffs into the lungs every 4 (four) hours as needed for wheezing or shortness of breath.   1 Inhaler   0   . loratadine (CLARITIN) 10 MG tablet   Oral   Take 10 mg by mouth daily as needed for allergies.          BP 114/81  Pulse 93  Temp(Src) 98 F (36.7 C) (Oral)  Resp 16  SpO2 99%  LMP 12/18/2012 Physical Exam  Nursing note and vitals reviewed. Constitutional: She is oriented to person, place, and time. She appears well-developed and well-nourished. No distress.  HENT:  Head: Normocephalic and atraumatic.  Mouth/Throat: Oropharynx is clear and moist.  Cardiovascular: Normal rate and regular rhythm.   Pulmonary/Chest: Effort normal and breath sounds normal. No respiratory distress. She has no wheezes. She has no rales.  Abdominal: Soft. There is no tenderness. There is no rebound and no guarding.  Genitourinary:  Chaperone present. There is moderate amount of clear vaginal discharge with slight greenish tint and frothy. Cervical os closed. No adnexal tenderness or mass. No CMT. No bleeding.  Musculoskeletal: Normal range of  motion.  Neurological: She is alert and oriented to person, place, and time.  Skin: Skin is warm and dry. No rash noted.  Psychiatric: She has a normal mood and affect. Her behavior is normal.    ED Course  Procedures   COORDINATION OF CARE:  Nursing notes reviewed. Vital signs reviewed. Initial pt interview and examination performed.   Filed Vitals:   04/20/13 0426  BP: 114/81  Pulse: 93  Temp: 98 F (36.7 C)  TempSrc: Oral  Resp: 16  SpO2: 99%    4:57 AM-patient seen and evaluated. Patient well-appearing no acute distress  normal respirations and O2 sats. Reports feeling improved after reading she moved from EMS. Lungs clear this time.  Patient treated with Flagyl for Trichomonas. As the symptoms resolved. The patient sent home with albuterol inhaler.  04/20/2013 9:00PM on later review of patient's testing she also had positive gonorrhea probe. I did attempt to call the patient at her phone number listed, [phone].  There was no connection of this given phone number. Information was passed on to the flow manager to try to contact the patient for the need for further treatment.    Treatment plan initiated: Medications  metroNIDAZOLE (FLAGYL) tablet 2,000 mg (2,000 mg Oral Given 04/20/13 0601)   Results for orders placed during the hospital encounter of 04/20/13  GC/CHLAMYDIA PROBE AMP      Result Value Ref Range   CT Probe RNA NEGATIVE  NEGATIVE   GC Probe RNA POSITIVE (*) NEGATIVE  WET PREP, GENITAL      Result Value Ref Range   Yeast Wet Prep HPF POC NONE SEEN  NONE SEEN   Trich, Wet Prep FEW (*) NONE SEEN   Clue Cells Wet Prep HPF POC NONE SEEN  NONE SEEN   WBC, Wet Prep HPF POC MODERATE (*) NONE SEEN  URINALYSIS, ROUTINE W REFLEX MICROSCOPIC      Result Value Ref Range   Color, Urine YELLOW  YELLOW   APPearance CLOUDY (*) CLEAR   Specific Gravity, Urine 1.013  1.005 - 1.030   pH 5.5  5.0 - 8.0   Glucose, UA NEGATIVE  NEGATIVE mg/dL   Hgb urine dipstick NEGATIVE  NEGATIVE   Bilirubin Urine NEGATIVE  NEGATIVE   Ketones, ur NEGATIVE  NEGATIVE mg/dL   Protein, ur NEGATIVE  NEGATIVE mg/dL   Urobilinogen, UA 0.2  0.0 - 1.0 mg/dL   Nitrite NEGATIVE  NEGATIVE   Leukocytes, UA MODERATE (*) NEGATIVE  URINE MICROSCOPIC-ADD ON      Result Value Ref Range   Squamous Epithelial / LPF RARE  RARE   WBC, UA 7-10  <3 WBC/hpf   Bacteria, UA RARE  RARE   Urine-Other TRICHOMONAS PRESENT    POC URINE PREG, ED      Result Value Ref Range   Preg Test, Ur NEGATIVE  NEGATIVE       MDM   Final diagnoses:   Asthma  Trichomonas vaginitis       Angus Seller, PA-C 04/20/13 2150

## 2013-04-20 NOTE — ED Notes (Signed)
Pt started of with breathing complications due to asthma. Pt given 5mg  of albuterol in route and had clear lung sound post treatment. Pt stated she still wanted come to the hospital due to increased vaginal discharge.

## 2013-04-21 ENCOUNTER — Telehealth (HOSPITAL_BASED_OUTPATIENT_CLINIC_OR_DEPARTMENT_OTHER): Payer: Self-pay | Admitting: Emergency Medicine

## 2013-04-21 NOTE — Telephone Encounter (Signed)
+  Gonorrhea. Patient treated with Doxycyline and Suprax. [employer] faxed.

## 2013-04-26 ENCOUNTER — Emergency Department (HOSPITAL_COMMUNITY)
Admission: EM | Admit: 2013-04-26 | Discharge: 2013-04-26 | Disposition: A | Discharge status: Home / Self Care | Payer: Self-pay | Attending: Emergency Medicine | Admitting: Emergency Medicine

## 2013-04-26 ENCOUNTER — Encounter (HOSPITAL_COMMUNITY): Payer: Self-pay | Admitting: Emergency Medicine

## 2013-04-26 DIAGNOSIS — Z79899 Other long term (current) drug therapy: Secondary | ICD-10-CM | POA: Insufficient documentation

## 2013-04-26 DIAGNOSIS — R509 Fever, unspecified: Secondary | ICD-10-CM | POA: Insufficient documentation

## 2013-04-26 DIAGNOSIS — R000 Tachycardia, unspecified: Secondary | ICD-10-CM | POA: Insufficient documentation

## 2013-04-26 DIAGNOSIS — Z792 Long term (current) use of antibiotics: Secondary | ICD-10-CM | POA: Insufficient documentation

## 2013-04-26 DIAGNOSIS — Z9119 Patient's noncompliance with other medical treatment and regimen: Secondary | ICD-10-CM | POA: Insufficient documentation

## 2013-04-26 DIAGNOSIS — J45901 Unspecified asthma with (acute) exacerbation: Secondary | ICD-10-CM | POA: Insufficient documentation

## 2013-04-26 DIAGNOSIS — A64 Unspecified sexually transmitted disease: Secondary | ICD-10-CM | POA: Insufficient documentation

## 2013-04-26 DIAGNOSIS — Z91199 Patient's noncompliance with other medical treatment and regimen due to unspecified reason: Secondary | ICD-10-CM | POA: Insufficient documentation

## 2013-04-26 MED ORDER — AEROCHAMBER PLUS FLO-VU MEDIUM MISC
1.0000 | Freq: Once | Status: AC
  Administered 2013-04-26: 1

## 2013-04-26 MED ORDER — METRONIDAZOLE 500 MG PO TABS: 500.0000 mg | ORAL_TABLET | Freq: Two times a day (BID) | ORAL | Status: DC

## 2013-04-26 MED ORDER — ALBUTEROL SULFATE HFA 108 (90 BASE) MCG/ACT IN AERS: 2.0000 | INHALATION_SPRAY | Freq: Four times a day (QID) | RESPIRATORY_TRACT | Status: DC | PRN

## 2013-04-26 MED ORDER — DOXYCYCLINE HYCLATE 100 MG PO CAPS: 100.0000 mg | ORAL_CAPSULE | Freq: Two times a day (BID) | ORAL | Status: DC

## 2013-04-26 MED ORDER — IPRATROPIUM-ALBUTEROL 0.5-2.5 (3) MG/3ML IN SOLN
5.0000 mL | Freq: Once | RESPIRATORY_TRACT | Status: AC
  Administered 2013-04-26: 3 mL via RESPIRATORY_TRACT

## 2013-04-26 MED ORDER — PREDNISONE 20 MG PO TABS: 40.0000 mg | ORAL_TABLET | Freq: Every day | ORAL | Status: DC

## 2013-04-26 MED ORDER — PREDNISONE 20 MG PO TABS
40.0000 mg | ORAL_TABLET | Freq: Once | ORAL | Status: AC
  Administered 2013-04-26: 40 mg via ORAL
  Filled 2013-04-26: qty 2

## 2013-04-26 MED ORDER — CEFTRIAXONE SODIUM 250 MG IJ SOLR
250.0000 mg | Freq: Once | INTRAMUSCULAR | Status: AC
  Administered 2013-04-26: 250 mg via INTRAMUSCULAR
  Filled 2013-04-26: qty 250

## 2013-04-26 MED ORDER — ALBUTEROL SULFATE HFA 108 (90 BASE) MCG/ACT IN AERS
2.0000 | INHALATION_SPRAY | Freq: Once | RESPIRATORY_TRACT | Status: AC
Start: 2013-04-26 — End: 2013-04-26
  Administered 2013-04-26: 2 via RESPIRATORY_TRACT
  Filled 2013-04-26: qty 6.7

## 2013-04-26 MED ORDER — SODIUM CHLORIDE 0.9 % IJ SOLN
INTRAMUSCULAR | Status: AC
  Administered 2013-04-26: 1 mL
  Filled 2013-04-26: qty 10

## 2013-04-26 NOTE — ED Notes (Signed)
Bed: [id number] Expected date:  Expected time:  Means of arrival:  Comments: EMS-astma

## 2013-04-26 NOTE — ED Notes (Signed)
Per EMS patient with Hx of asthma comes to ED to get inhalers regularly when she runs out. Patient ran out of her previous inhaler and now comes to ED with cough and asthma in order to get another inhaler for self-treatment. EMS states patient was wheezing on site, wheezing has subsided since 10 mg albuterol and 0.5 mg Atrovent nebulizer treatment. Baby at bedside.

## 2013-04-26 NOTE — ED Provider Notes (Signed)
CSN: [id number]     Arrival date & time 04/26/13  1658 History   None    Chief Complaint  Patient presents with  . Shortness of Breath     (Consider location/radiation/quality/duration/timing/severity/associated sxs/prior Treatment) HPI Comments: Nicole MoorsDeandra White is a 23 y.o. female with a past medical history of asthma presenting the Emergency Department with a chief complaint of wheezing.  The patient reports wheezing for several days, reports running out of her inhaler today.  Reports subjective fever and cough.  She reports she did not fill prescription from 1 week ago.  She reports she is still having mild pelvic pain and vaginal discharge.   The history is provided by the patient and medical records. No language interpreter was used.    Past Medical History  Diagnosis Date  . Asthma    Past Surgical History  Procedure Laterality Date  . No past surgeries     Family History  Problem Relation Age of Onset  . Asthma Father   . Autism Brother    History  Substance Use Topics  . Smoking status: Never Smoker   . Smokeless tobacco: Never Used  . Alcohol Use: No   OB History   Grav Para Term Preterm Abortions TAB SAB Ect Mult Living                 Review of Systems  Constitutional: Positive for fever. Negative for chills.  Respiratory: Positive for cough, chest tightness, shortness of breath and wheezing.   Cardiovascular: Negative for chest pain, palpitations and leg swelling.  Gastrointestinal: Negative for abdominal pain.  Genitourinary: Positive for vaginal discharge and pelvic pain.      Allergies  Peanut-containing drug products and Coconut flavor  Home Medications   Prior to Admission medications   Medication Sig Start Date End Date Taking? Authorizing Provider  albuterol (PROVENTIL HFA;VENTOLIN HFA) 108 (90 BASE) MCG/ACT inhaler Inhale 2 puffs into the lungs every 4 (four) hours as needed for wheezing or shortness of breath. 04/03/13   Kristen N Ward, DO   azithromycin (ZITHROMAX) 250 MG tablet Take 4 tablets (1,000 mg total) by mouth once. 04/20/13   Phill MutterPeter S Dammen, PA-C  cefixime (SUPRAX) 400 MG tablet Take 1 tablet (400 mg total) by mouth daily. Once only 04/20/13   Phill MutterPeter S Dammen, PA-C  doxycycline (VIBRAMYCIN) 100 MG capsule Take 1 capsule (100 mg total) by mouth 2 (two) times daily. 04/20/13   Phill MutterPeter S Dammen, PA-C  loratadine (CLARITIN) 10 MG tablet Take 10 mg by mouth daily as needed for allergies.    Historical Provider, MD   LMP 12/18/2012 Physical Exam  Nursing note and vitals reviewed. Constitutional: She is oriented to person, place, and time. She appears well-developed and well-nourished. No distress.  HENT:  Head: Normocephalic and atraumatic.  Eyes: EOM are normal. Pupils are equal, round, and reactive to light.  Neck: Neck supple.  Cardiovascular: Normal heart sounds.  Tachycardia present.   Pulmonary/Chest: Effort normal. No respiratory distress. She has wheezes. She has no rales.  Patient is able to speak in complete sentences.   Abdominal: Soft. There is no tenderness.  Musculoskeletal: Normal range of motion. She exhibits no edema and no tenderness.  Neurological: She is alert and oriented to person, place, and time.  Skin: Skin is warm and dry. She is not diaphoretic.  Psychiatric: She has a normal mood and affect. Her behavior is normal.    ED Course  Procedures (including critical care time) Labs Review Labs  Reviewed - No data to display  Imaging Review No results found.   EKG Interpretation None      MDM   Final diagnoses:  Asthma exacerbation  Medical non-compliance  STI (sexually transmitted infection)   Pt presents with asthma exacerbation, given albuterol 10 mg by EMS.  Prednisone ordered. Will re-eval once finished.  She is also about her her inhaler, frequent visits for medication refill, case management consulted. Afebrile, tachycardic secondary to albuterol, denies palpations or chest  pain. Re-eval: pt reports symptom improvement. Wheezing bilaterally on ausculation. Duoneb ordered. Case manager involved due to repeated visits for similar complaints and no PCP. Re-eval: pt reports symptom improvement.  O2 100% RA, mild wheezing on exam. Pt reports she no longer has the prescription for antibiotics from her previous visit, will represcribe antibiotics. Discussed lab results, imaging results, and treatment plan with the patient. Return precautions given. Reports understanding and no other concerns at this time.  Patient is stable for discharge at this time.   Meds given in ED:  Medications  predniSONE (DELTASONE) tablet 40 mg (40 mg Oral Given 04/26/13 1731)  ipratropium-albuterol (DUONEB) 0.5-2.5 (3) MG/3ML nebulizer solution 5 mL (3 mLs Nebulization Given 04/26/13 1823)  cefTRIAXone (ROCEPHIN) injection 250 mg (250 mg Intramuscular Given 04/26/13 2000)  sodium chloride 0.9 % injection (1 mL  Given 04/26/13 2001)  albuterol (PROVENTIL HFA;VENTOLIN HFA) 108 (90 BASE) MCG/ACT inhaler 2 puff (2 puffs Inhalation Given 04/26/13 2020)  AEROCHAMBER PLUS FLO-VU MEDIUM device MISC 1 each (1 each Other Given 04/26/13 2020)    Discharge Medication List as of 04/26/2013  7:31 PM    START taking these medications   Details  !! albuterol (PROVENTIL HFA;VENTOLIN HFA) 108 (90 BASE) MCG/ACT inhaler Inhale 2 puffs into the lungs every 6 (six) hours as needed for wheezing or shortness of breath., Starting 04/26/2013, Until Discontinued, Print    !! doxycycline (VIBRAMYCIN) 100 MG capsule Take 1 capsule (100 mg total) by mouth 2 (two) times daily., Starting 04/26/2013, Until Discontinued, Print    metroNIDAZOLE (FLAGYL) 500 MG tablet Take 1 tablet (500 mg total) by mouth 2 (two) times daily., Starting 04/26/2013, Until Discontinued, Print    predniSONE (DELTASONE) 20 MG tablet Take 2 tablets (40 mg total) by mouth daily. Take 40 mg by mouth daily for 3 days, then 20mg  by mouth daily for 3 days,  then 10mg  daily for 3 days, Starting 04/26/2013, Until Discontinued, Print     !! - Potential duplicate medications found. Please discuss with provider.         Clabe SealLauren M Jazalyn Mondor, PA-C 04/27/13 1426

## 2013-04-26 NOTE — Progress Notes (Signed)
   CARE MANAGEMENT ED NOTE 04/26/2013  Patient:  Nicole White,Nicole White   Account Number:  [id number]  Date Initiated:  04/26/2013  Documentation initiated by:  Radford PaxFERRERO,Luvern Mcisaac  Subjective/Objective Assessment:   Patient presents to Ed with cough and wheezing/ wanted to get medications for asthma.     Subjective/Objective Assessment Detail:     Action/Plan:   Action/Plan Detail:   Anticipated DC Date:  04/26/2013     Status Recommendation to Physician:   Result of Recommendation:    Other ED Services  Consult Working Plan    DC Planning Services  Other  PCP issues    Choice offered to / List presented to:            Status of service:  Completed, signed off  ED Comments:   ED Comments Detail:  EDCM spoke to patient at bedside.  Patient is unemployed. Patient reports she has applied for Medicaid but was denied.  [facility] asked patient when she was here in the Ed a few days ago if she got her prescriptions filled.  Patinet reports, "No, I didn't get them filled."  [facility] asked patient if she still had the prescriptions, patient stated, "I don't know where I put them, but I should still have them."  Patient without pcp.  Per chart review, [facility] and [facility] Jan [facility], [facility] at twelve noon.  [facility] informed patient of above information and patient stated, "Oh, they did?"  EDCM provided patient with the phone number and address of [facility], list of financial resources in the community such as local churches and [employer],  [employer] pharmacies and websites needymeds.org and GoodRX.com for medication assistance, phone number to inquire about the orange card and Affordable Care Act and dental assistance for patients without insurance.  EDCM strongly advised [facility], make an appointment, and ask for assistance with her  medications. [facility] also informed patient that she would be able to sign up for orange card there.  EDCM instructed patient on the importance of having a pcp to follow her with her asthma and that the ED is not her pcp.  Patient agreed and voiced understanding.  [facility] that [facility].  [facility] informed patient that walk ins are from 9am to 1030am Mon through [employer].  Patient thankful for assistance.  No further EDCM needs at this time.  EDCM made [facility] also [facility] to report patient needing assist with medications.

## 2013-04-26 NOTE — Discharge Instructions (Signed)
Call for a follow up appointment with a Family or Primary Care Provider.  Return if Symptoms worsen.   Take medication as prescribed.  Take your medications as prescribed. You have been treated in the emergency department for an infection, possibly sexually transmitted. Results of your gonorrhea and chlamydia tests are pending and you will be notified if they are positive. It is very important to practice safe sex and use condoms when sexually active. If your results are positive you need to notify all sexual partners so they can be treated as well. The website [email or web address]://garcia.[email or web address]://www.dontspreadit.com/ can be used to send anonymous text messages or emails to alert sexual contacts. Follow up with your doctor, or OBGYN in regards to today's visit.    Gonorrhea and Chlamydia SYMPTOMS  In females, symptoms may go unnoticed. Symptoms that are more noticeable can include:  Belly (abdominal) pain.  Painful intercourse.  Watery mucous-like discharge from the vagina.  Miscarriage.  Discomfort when urinating.  Inflammation of the rectum.  Abnormal gray-green frothy vaginal discharge  Vaginal itching and irritatio  Itching and irritation of the area outside the vagina.   Painful urination.  Bleeding after sexual intercourse.  In males, symptoms include:  Burning with urination.  Pain in the testicles.  Watery mucous-like discharge from the penis.  It can cause longstanding (chronic) pelvic pain after frequent infections.  TREATMENT  PID can cause women to not be able to have children (sterile) if left untreated or if half-treated.  It is important to finish ALL medications given to you.  This is a sexually transmitted infection. So you are also at risk for other sexually transmitted diseases, including HIV (AIDS), it is recommended that you get tested. HOME CARE INSTRUCTIONS  Warning: This infection is contagious. Do not have sex until treatment is completed. Follow up at your caregiver's office or the clinic  to which you were referred. If your diagnosis (learning what is wrong) is confirmed by culture or some other method, your recent sexual contacts need treatment. Even if they are symptom free or have a negative culture or evaluation, they should be treated.  PREVENTION  Women should use sanitary pads instead of tampons for vaginal discharge.  Wipe front to back after using the toilet and avoid douching.   Practice safe sex, use condoms, have only one sex partner and be sure your sex partner is not having sex with others.  Ask your caregiver to test you for chlamydia at your regular checkups or sooner if you are having symptoms.  Ask for further information if you are pregnant.  SEEK IMMEDIATE MEDICAL CARE IF:  You develop an oral temperature above 102 F (38.9 C), not controlled by medications or lasting more than 2 days.  You develop an increase in pain.  You develop any type of abnormal discharge.  You develop vaginal bleeding and it is not time for your period.  You develop painful intercourse.   Bacterial Vaginosis  Bacterial vaginosis (BV) is a vaginal infection where the normal balance of bacteria in the vagina is disrupted. This is not a sexually transmitted disease and your sexual partners do NOT need to be treated. CAUSES  The cause of BV is not fully understood. BV develops when there is an increase or imbalance of harmful bacteria.  Some activities or behaviors can upset the normal balance of bacteria in the vagina and put women at increased risk including:  Having a new sex partner or multiple sex partners.  Douching.  Using an  intrauterine device (IUD) for contraception.  It is not clear what role sexual activity plays in the development of BV. However, women that have never had sexual intercourse are rarely infected with BV.  Women do not get BV from toilet seats, bedding, swimming pools or from touching objects around them.   SYMPTOMS  Grey vaginal discharge.  A fish-like  odor with discharge, especially after sexual intercourse.  Itching or burning of the vagina and vulva.  Burning or pain with urination.  Some women have no signs or symptoms at all.   TREATMENT  Sometimes BV will clear up without treatment.  BV may be treated with antibiotics.  BV can recur after treatment. If this happens, a second round of antibiotics will often be prescribed.  HOME CARE INSTRUCTIONS  Finish all medication as directed by your caregiver.  Do not have sex until treatment is completed.  Do NOT drink any alcoholic beverages while being treated  with Metronidazole (Flagyl). This will cause a severe reaction inducing vomiting.  RESOURCE GUIDE  Dental Problems  Patients with Medicaid: [facility]                     [facility] [phone]                                           [phone] Phone:  [phone]                                                  Phone:  [phone]  If unable to pay or uninsured, contact:  [facility] or [facility] to become qualified for the adult dental clinic.  Chronic Pain Problems Contact [facility]  [phone] Patients need to be referred by their primary care doctor.  Insufficient Money for Medicine Contact United Way:  call "211" or [facility] [phone].  No Primary Care Doctor Call [facility]  [phone] Other agencies that provide inexpensive medical care    [facility]  [phone]    [facility]  [phone]    [facility]  [phone]    [facility]  [phone]    [facility]  [phone]    [facility]  [phone]  Psychological Services [facility]  [phone] [facility]  [phone] [facility]   [phone] (emergency services [phone])  Substance Abuse Resources [facility]  [phone] [facility]  [phone] [facility] [phone] [facility] [phone] Residential & Outpatient Substance Abuse Program  [phone]  Abuse/Neglect [facility] Hotline [phone](336) [phone] [facility] Hotline 769-797-5101502 [phone] (After Hours)  Emergency Shelter [facility] [phone]  M[facility] Homes [facility] [place] [facility] [place] [phone] [facility] [phone]  MRSA Hotline #:   (859)554-6051908-526-4985    [facility]  [facility]     United Way                          [facility] [address] [place]  [address]      [address]  °[place]                                                [place]                            [place] °Phone:  [phone]                                   Phone:  [phone]                 Phone:  [phone] ° °[place] [place] [facility] °Phone:  342-8316 ° °[place] Child Abuse Hotline °(336) 342-1394 °([phone] (After Hours) ° ° ° ° °

## 2013-04-28 NOTE — ED Provider Notes (Signed)
Medical screening examination/treatment/procedure(s) were performed by non-physician practitioner and as supervising physician I was immediately available for consultation/collaboration.   EKG Interpretation None       Aysia Lowder, MD 04/28/13 1504 

## 2013-04-29 ENCOUNTER — Encounter (HOSPITAL_BASED_OUTPATIENT_CLINIC_OR_DEPARTMENT_OTHER): Payer: Self-pay | Admitting: Emergency Medicine

## 2013-04-29 ENCOUNTER — Emergency Department (HOSPITAL_BASED_OUTPATIENT_CLINIC_OR_DEPARTMENT_OTHER)
Admission: EM | Admit: 2013-04-29 | Discharge: 2013-04-29 | Disposition: A | Discharge status: Home / Self Care | Payer: Self-pay | Attending: Emergency Medicine | Admitting: Emergency Medicine

## 2013-04-29 DIAGNOSIS — R51 Headache: Secondary | ICD-10-CM | POA: Insufficient documentation

## 2013-04-29 DIAGNOSIS — J45901 Unspecified asthma with (acute) exacerbation: Secondary | ICD-10-CM | POA: Insufficient documentation

## 2013-04-29 DIAGNOSIS — J209 Acute bronchitis, unspecified: Secondary | ICD-10-CM

## 2013-04-29 DIAGNOSIS — Z79899 Other long term (current) drug therapy: Secondary | ICD-10-CM | POA: Insufficient documentation

## 2013-04-29 DIAGNOSIS — R000 Tachycardia, unspecified: Secondary | ICD-10-CM | POA: Insufficient documentation

## 2013-04-29 MED ORDER — ALBUTEROL SULFATE (2.5 MG/3ML) 0.083% IN NEBU
INHALATION_SOLUTION | RESPIRATORY_TRACT | Status: AC
  Administered 2013-04-29: 5 mg
  Filled 2013-04-29: qty 6

## 2013-04-29 MED ORDER — ALBUTEROL SULFATE HFA 108 (90 BASE) MCG/ACT IN AERS
4.0000 | INHALATION_SPRAY | Freq: Once | RESPIRATORY_TRACT | Status: AC
  Administered 2013-04-29: 4 via RESPIRATORY_TRACT
  Filled 2013-04-29: qty 6.7

## 2013-04-29 MED ORDER — PREDNISONE 50 MG PO TABS
60.0000 mg | ORAL_TABLET | Freq: Once | ORAL | Status: AC
  Administered 2013-04-29: 60 mg via ORAL
  Filled 2013-04-29 (×2): qty 1

## 2013-04-29 NOTE — ED Notes (Signed)
Pt reports she developed wheeze on Thursday, was seen and treated states it worsened this morning. Presents with wheeze throughout lung fields.

## 2013-04-29 NOTE — Discharge Instructions (Signed)
Bronchitis Bronchitis is swelling (inflammation) of the air tubes leading to your lungs (bronchi). This causes mucus and a cough. If the swelling gets bad, you may have trouble breathing. HOME CARE   Rest.  Drink enough fluids to keep your pee (urine) clear or pale yellow (unless you have a condition where you have to watch how much you drink).  Only take medicine as told by your doctor. If you were given antibiotic medicines, finish them even if you start to feel better.  Avoid smoke, irritating chemicals, and strong smells. These make the problem worse. Quit smoking if you smoke. This helps your lungs heal faster.  Use a cool mist humidifier. Change the water in the humidifier every day. You can also sit in the bathroom with hot shower running for 5 10 minutes. Keep the door closed.  See your health care provider as told.  Wash your hands often. GET HELP IF: Your problems do not get better after 1 week. GET HELP RIGHT AWAY IF:   Your fever gets worse.  You have chills.  Your chest hurts.  Your problems breathing get worse.  You have blood in your mucus.  You pass out (faint).  You feel lightheaded.  You have a bad headache.  You throw up (vomit) again and again. MAKE SURE YOU:  Understand these instructions.  Will watch your condition.  Will get help right away if you are not doing well or get worse. Document Released: 06/16/2007 Document Revised: 10/18/2012 Document Reviewed: 08/22/2012 [facility] Patient Information 2014 [place], [place]. Asthma, Adult Asthma is a recurring condition in which the airways tighten and narrow. Asthma can make it difficult to breathe. It can cause coughing, wheezing, and shortness of breath. Asthma episodes (also called asthma attacks) range from minor to life-threatening. Asthma cannot be cured, but medicines and lifestyle changes can help control it. CAUSES Asthma is believed to be caused by inherited (genetic) and environmental  factors, but its exact cause is unknown. Asthma may be triggered by allergens, lung infections, or irritants in the air. Asthma triggers are different for each person. Common triggers include:   Animal dander.  Dust mites.  Cockroaches.  Pollen from trees or grass.  Mold.  Smoke.  Air pollutants such as dust, household cleaners, hair sprays, aerosol sprays, paint fumes, strong chemicals, or strong odors.  Cold air, weather changes, and winds (which increase molds and pollens in the air).  Strong emotional expressions such as crying or laughing hard.  Stress.  Certain medicines (such as aspirin) or types of drugs (such as beta-blockers).  Sulfites in foods and drinks. Foods and drinks that may contain sulfites include dried fruit, potato chips, and sparkling grape juice.  Infections or inflammatory conditions such as the flu, a cold, or an inflammation of the nasal membranes (rhinitis).  Gastroesophageal reflux disease (GERD).  Exercise or strenuous activity. SYMPTOMS Symptoms may occur immediately after asthma is triggered or many hours later. Symptoms include:  Wheezing.  Excessive nighttime or early morning coughing.  Frequent or severe coughing with a common cold.  Chest tightness.  Shortness of breath. DIAGNOSIS  The diagnosis of asthma is made by a review of your medical history and a physical exam. Tests may also be performed. These may include:  Lung function studies. These tests show how much air you breath in and out.  Allergy tests.  Imaging tests such as X-rays. TREATMENT  Asthma cannot be cured, but it can usually be controlled. Treatment involves identifying and avoiding your  asthma triggers. It also involves medicines. There are 2 classes of medicine used for asthma treatment:   Controller medicines. These prevent asthma symptoms from occurring. They are usually taken every day.  Reliever or rescue medicines. These quickly relieve asthma symptoms.  They are used as needed and provide short-term relief. Your health care provider will help you create an asthma action plan. An asthma action plan is a written plan for managing and treating your asthma attacks. It includes a list of your asthma triggers and how they may be avoided. It also includes information on when medicines should be taken and when their dosage should be changed. An action plan may also involve the use of a device called a peak flow meter. A peak flow meter measures how well the lungs are working. It helps you monitor your condition. HOME CARE INSTRUCTIONS   Take medicine as directed by your health care provider. Speak with your health care provider if you have questions about how or when to take the medicines.  Use a peak flow meter as directed by your health care provider. Record and keep track of readings.  Understand and use the action plan to help minimize or stop an asthma attack without needing to seek medical care.  Control your home environment in the following ways to help prevent asthma attacks:  Do not smoke. Avoid being exposed to secondhand smoke.  Change your heating and air conditioning filter regularly.  Limit your use of fireplaces and wood stoves.  Get rid of pests (such as roaches and mice) and their droppings.  Throw away plants if you see mold on them.  Clean your floors and dust regularly. Use unscented cleaning products.  Try to have someone else vacuum for you regularly. Stay out of rooms while they are being vacuumed and for a short while afterward. If you vacuum, use a dust mask from a hardware store, a double-layered or microfilter vacuum cleaner bag, or a vacuum cleaner with a HEPA filter.  Replace carpet with wood, tile, or vinyl flooring. Carpet can trap dander and dust.  Use allergy-proof pillows, mattress covers, and box spring covers.  Wash bed sheets and blankets every week in hot water and dry them in a dryer.  Use blankets  that are made of polyester or cotton.  Clean bathrooms and kitchens with bleach. If possible, have someone repaint the walls in these rooms with mold-resistant paint. Keep out of the rooms that are being cleaned and painted.  Wash hands frequently. SEEK MEDICAL CARE IF:   You have wheezing, shortness of breath, or a cough even if taking medicine to prevent attacks.  The colored mucus you cough up (sputum) is thicker than usual.  Your sputum changes from clear or white to yellow, green, gray, or bloody.  You have any problems that may be related to the medicines you are taking (such as a rash, itching, swelling, or trouble breathing).  You are using a reliever medicine more than 2 3 times per week.  Your peak flow is still at 50 79% of you personal best after following your action plan for 1 hour. SEEK IMMEDIATE MEDICAL CARE IF:   You seem to be getting worse and are unresponsive to treatment during an asthma attack.  You are short of breath even at rest.  You get short of breath when doing very little physical activity.  You have difficulty eating, drinking, or talking due to asthma symptoms.  You develop chest pain.  You develop  a fast heartbeat.  You have a bluish color to your lips or fingernails.  You are lightheaded, dizzy, or faint.  Your peak flow is less than 50% of your personal best.  You have a fever or persistent symptoms for more than 2 3 days.  You have a fever and symptoms suddenly get worse. MAKE SURE YOU:   Understand these instructions.  Will watch your condition.  Will get help right away if you are not doing well or get worse. Document Released: 12/28/2004 Document Revised: 08/30/2012 Document Reviewed: 07/27/2012 [facility] Patient Information 2014 [place], [place].

## 2013-04-29 NOTE — ED Provider Notes (Signed)
CSN: [id number]     Arrival date & time 04/29/13  0609 History   First MD Initiated Contact with Patient 04/29/13 [phone]     Chief Complaint  Patient presents with  . Asthma     (Consider location/radiation/quality/duration/timing/severity/associated sxs/prior Treatment) HPI Comments: Pt is a 23 y.o. female with Pmhx as above who presents with productive cough, inc SOB & wheezing, pressure-like intermittent frontal headache, malaise, decreased PO intake for about 2 weeks. Pt seen on 4/10, and 4/16 for same, has still not filled Rx's from either visit including prednisone, doxy, flagyl from 4/10, or doxy azithro from 4/16.  She denies fevers since 3 days ago.  Patient is a 23 y.o. female presenting with asthma. The history is provided by the patient. No language interpreter was used.  Asthma This is a recurrent problem. The current episode started more than 1 week ago. The problem occurs constantly. The problem has not changed since onset.Associated symptoms include headaches and shortness of breath. Pertinent negatives include no chest pain and no abdominal pain. The symptoms are aggravated by exertion. The symptoms are relieved by rest. She has tried nothing (Seen twice for same in past month and has not filled Rxs) for the symptoms. The treatment provided no relief.    Past Medical History  Diagnosis Date  . Asthma    Past Surgical History  Procedure Laterality Date  . No past surgeries     Family History  Problem Relation Age of Onset  . Asthma Father   . Autism Brother    History  Substance Use Topics  . Smoking status: Never Smoker   . Smokeless tobacco: Never Used  . Alcohol Use: No   OB History   Grav Para Term Preterm Abortions TAB SAB Ect Mult Living                 Review of Systems  Constitutional: Positive for activity change and appetite change. Negative for fever, chills, diaphoresis and fatigue.  HENT: Negative for congestion, facial swelling, rhinorrhea and  sore throat.   Eyes: Negative for photophobia and discharge.  Respiratory: Positive for shortness of breath. Negative for cough and chest tightness.   Cardiovascular: Negative for chest pain, palpitations and leg swelling.  Gastrointestinal: Negative for nausea, vomiting, abdominal pain and diarrhea.  Endocrine: Negative for polydipsia and polyuria.  Genitourinary: Negative for dysuria, frequency, difficulty urinating and pelvic pain.  Musculoskeletal: Negative for arthralgias, back pain, neck pain and neck stiffness.  Skin: Negative for color change and wound.  Allergic/Immunologic: Negative for immunocompromised state.  Neurological: Positive for headaches. Negative for facial asymmetry, weakness and numbness.  Hematological: Does not bruise/bleed easily.  Psychiatric/Behavioral: Negative for confusion and agitation.      Allergies  Peanut-containing drug products and Coconut flavor  Home Medications   Prior to Admission medications   Medication Sig Start Date End Date Taking? Authorizing Provider  albuterol (PROVENTIL HFA;VENTOLIN HFA) 108 (90 BASE) MCG/ACT inhaler Inhale 2 puffs into the lungs every 4 (four) hours as needed for wheezing or shortness of breath. 04/03/13  Yes Kristen N Ward, DO  albuterol (PROVENTIL HFA;VENTOLIN HFA) 108 (90 BASE) MCG/ACT inhaler Inhale 2 puffs into the lungs every 6 (six) hours as needed for wheezing or shortness of breath. 04/26/13  Yes Lauren Doretha ImusM Parker, PA-C  fexofenadine (ALLEGRA) 180 MG tablet Take 180 mg by mouth daily as needed for allergies or rhinitis.   Yes Historical Provider, MD  azithromycin (ZITHROMAX) 250 MG tablet Take 4 tablets (1,000  mg total) by mouth once. 04/20/13   Phill MutterPeter S Dammen, PA-C  cefixime (SUPRAX) 400 MG tablet Take 1 tablet (400 mg total) by mouth daily. Once only 04/20/13   Phill MutterPeter S Dammen, PA-C  doxycycline (VIBRAMYCIN) 100 MG capsule Take 1 capsule (100 mg total) by mouth 2 (two) times daily. 04/20/13   Phill MutterPeter S Dammen, PA-C   doxycycline (VIBRAMYCIN) 100 MG capsule Take 1 capsule (100 mg total) by mouth 2 (two) times daily. 04/26/13   Lauren Doretha ImusM Parker, PA-C  metroNIDAZOLE (FLAGYL) 500 MG tablet Take 1 tablet (500 mg total) by mouth 2 (two) times daily. 04/26/13   Lauren Doretha ImusM Parker, PA-C  predniSONE (DELTASONE) 20 MG tablet Take 2 tablets (40 mg total) by mouth daily. Take 40 mg by mouth daily for 3 days, then 20mg  by mouth daily for 3 days, then 10mg  daily for 3 days 04/26/13   Clabe SealLauren M Parker, PA-C   BP 122/81  Pulse 115  Temp(Src) 98.5 F (36.9 C) (Oral)  Resp 23  Ht 5' (1.524 m)  Wt 152 lb 5 oz (69.088 kg)  BMI 29.75 kg/m2  SpO2 100%  LMP 02/19/2013 Physical Exam  Constitutional: She is oriented to person, place, and time. She appears well-developed and well-nourished. No distress.  HENT:  Head: Normocephalic and atraumatic.  Mouth/Throat: No oropharyngeal exudate.  Eyes: Pupils are equal, round, and reactive to light.  Neck: Normal range of motion. Neck supple.  Cardiovascular: Regular rhythm and normal heart sounds.  Tachycardia present.  Exam reveals no gallop and no friction rub.   No murmur heard. Pulmonary/Chest: Effort normal. No accessory muscle usage. Not tachypneic. No respiratory distress. She has wheezes in the right middle field, the right lower field, the left middle field and the left lower field. She has no rales.  Abdominal: Soft. Bowel sounds are normal. She exhibits no distension and no mass. There is no tenderness. There is no rebound and no guarding.  Musculoskeletal: Normal range of motion. She exhibits no edema and no tenderness.  Neurological: She is alert and oriented to person, place, and time.  Skin: Skin is warm and dry.  Psychiatric: She has a normal mood and affect.    ED Course  Procedures (including critical care time) Labs Review Labs Reviewed - No data to display  Imaging Review No results found.   EKG Interpretation None      MDM   Final diagnoses:  Acute  bronchitis with bronchospasm    Pt is a 23 y.o. female with Pmhx as above who presents with productive cough, inc SOB & wheezing, pressure-like intermittent fromtal headache, malaise, decreased PO intake for about 2 weeks. Pt seen on 4/10, and 4/16 for same, has still not filled Rx's from either visit including prednisone, doxy, flagyl from 4/10, or doxy azithro from 4/16.  She denies fevers since 3 days ago. On PE, Pt mildly tachycardic, but in NAD. Slight wheezing hear throughout. Posterior oropharynx nml, abdominal exam benign. Doubt pna given lack of fevers or crackles heard on lung exam and suspect bronchitis with asthma exacerbation. Pt given PO prednisone and albuterol MDI. She has been instructed she must must fill prednisone, doxy, albuterol and flagyl Rx (BV diagnosis from previous visit). Return precautions given for new or worsening symptoms including worsening SOB, fever.          Shanna CiscoMegan E Docherty, MD 04/29/13 [phone]

## 2013-04-30 NOTE — ED Provider Notes (Signed)
Medical screening examination/treatment/procedure(s) were performed by non-physician practitioner and as supervising physician I was immediately available for consultation/collaboration.   EKG Interpretation None       Azuri Bozard M Yamin Swingler, MD 04/30/13 2229 

## 2013-05-21 ENCOUNTER — Emergency Department (HOSPITAL_COMMUNITY)
Admission: EM | Admit: 2013-05-21 | Discharge: 2013-05-21 | Disposition: A | Discharge status: Home / Self Care | Payer: Self-pay | Attending: Emergency Medicine | Admitting: Emergency Medicine

## 2013-05-21 ENCOUNTER — Emergency Department (HOSPITAL_COMMUNITY): Payer: Self-pay

## 2013-05-21 ENCOUNTER — Encounter (HOSPITAL_COMMUNITY): Payer: Self-pay | Admitting: Emergency Medicine

## 2013-05-21 DIAGNOSIS — IMO0002 Reserved for concepts with insufficient information to code with codable children: Secondary | ICD-10-CM | POA: Insufficient documentation

## 2013-05-21 DIAGNOSIS — Z3202 Encounter for pregnancy test, result negative: Secondary | ICD-10-CM | POA: Insufficient documentation

## 2013-05-21 DIAGNOSIS — J45909 Unspecified asthma, uncomplicated: Secondary | ICD-10-CM

## 2013-05-21 DIAGNOSIS — Z792 Long term (current) use of antibiotics: Secondary | ICD-10-CM | POA: Insufficient documentation

## 2013-05-21 DIAGNOSIS — J45901 Unspecified asthma with (acute) exacerbation: Secondary | ICD-10-CM | POA: Insufficient documentation

## 2013-05-21 DIAGNOSIS — Z79899 Other long term (current) drug therapy: Secondary | ICD-10-CM | POA: Insufficient documentation

## 2013-05-21 LAB — PREGNANCY, URINE: PREG TEST UR: NEGATIVE

## 2013-05-21 LAB — BASIC METABOLIC PANEL
BUN: 9 mg/dL (ref 6–23)
CO2: 26 meq/L (ref 19–32)
Calcium: 9.8 mg/dL (ref 8.4–10.5)
Chloride: 103 mEq/L (ref 96–112)
Creatinine, Ser: 0.82 mg/dL (ref 0.50–1.10)
GFR calc Af Amer: >90 mL/min (ref >90)
GFR calc non Af Amer: >90 mL/min (ref >90)
GLUCOSE: 105 mg/dL — AB (ref 70–99)
Potassium: 3.9 mEq/L (ref 3.7–5.3)
SODIUM: 142 meq/L (ref 137–147)

## 2013-05-21 LAB — CBC
HCT: 43.5 % (ref 36.0–46.0)
HEMOGLOBIN: 15 g/dL (ref 12.0–15.0)
MCH: 27.4 pg (ref 26.0–34.0)
MCHC: 34.5 g/dL (ref 30.0–36.0)
MCV: 79.4 fL (ref 78.0–100.0)
PLATELETS: 251 10*3/uL (ref 150–400)
RBC: 5.48 MIL/uL — AB (ref 3.87–5.11)
RDW: 13.2 % (ref 11.5–15.5)
WBC: 8.9 10*3/uL (ref 4.0–10.5)

## 2013-05-21 MED ORDER — ALBUTEROL SULFATE (2.5 MG/3ML) 0.083% IN NEBU: 5.0000 mg | INHALATION_SOLUTION | Freq: Once | RESPIRATORY_TRACT | Status: DC

## 2013-05-21 MED ORDER — PREDNISONE 20 MG PO TABS: ORAL_TABLET | ORAL | Status: DC

## 2013-05-21 MED ORDER — ALBUTEROL SULFATE HFA 108 (90 BASE) MCG/ACT IN AERS
2.0000 | INHALATION_SPRAY | Freq: Once | RESPIRATORY_TRACT | Status: AC
  Administered 2013-05-21: 2 via RESPIRATORY_TRACT
  Filled 2013-05-21: qty 6.7

## 2013-05-21 NOTE — ED Notes (Signed)
Pt presents with c/o shortness of breath. Per EMS, pt has a hx of asthma, pt was picked up by EMS and given 5 of albuterol and per EMS pt not has clear lung sounds. No acute distress at this time.

## 2013-05-21 NOTE — Discharge Instructions (Signed)
Please call your doctor for a followup appointment within 24-48 hours. When you talk to your doctor please let them know that you were seen in the emergency department and have them acquire all of your records so that they can discuss the findings with you and formulate a treatment plan to fully care for your new and ongoing problems. Please call and setup an appointment with her primary care provider to be reassessed Please rest and stay hydrated Please use albuterol as prescribed Please use prednisone as prescribed Please continue to monitor symptoms closely and if symptoms are to worsen or change (fever greater than 101, chills, chest pain, shortness of breath, difficulty breathing, numbness, tingling, closing sensation, coughing, coughing up blood, weakness or fainting) please report back to the ED immediately   Asthma, Adult Asthma is a condition of the lungs in which the airways tighten and narrow. Asthma can make it hard to breathe. Asthma cannot be cured, but medicine and lifestyle changes can help control it. Asthma may be started (triggered) by:  Animal skin flakes (dander).  Dust.  Cockroaches.  Pollen.  Mold.  Smoke.  Cleaning products.  Hair sprays or aerosol sprays.  Paint fumes or strong smells.  Cold air, weather changes, and winds.  Crying or laughing hard.  Stress.  Certain medicines or drugs.  Foods, such as dried fruit, potato chips, and sparkling grape juice.  Infections or conditions (colds, flu).  Exercise.  Certain medical conditions or diseases.  Exercise or tiring activities. HOME CARE   Take medicine as told by your doctor.  Use a peak flow meter as told by your doctor. A peak flow meter is a tool that measures how well the lungs are working.  Record and keep track of the peak flow meter's readings.  Understand and use the asthma action plan. An asthma action plan is a written plan for taking care of your asthma and treating your  attacks.  To help prevent asthma attacks:  Do not smoke. Stay away from secondhand smoke.  Change your heating and air conditioning filter often.  Limit your use of fireplaces and wood stoves.  Get rid of pests (such as roaches and mice) and their droppings.  Throw away plants if you see mold on them.  Clean your floors. Dust regularly. Use cleaning products that do not smell.  Have someone vacuum when you are not home. Use a vacuum cleaner with a HEPA filter if possible.  Replace carpet with wood, tile, or vinyl flooring. Carpet can trap animal skin flakes and dust.  Use allergy-proof pillows, mattress covers, and box spring covers.  Wash bed sheets and blankets every week in hot water and dry them in a dryer.  Use blankets that are made of polyester or cotton.  Clean bathrooms and kitchens with bleach. If possible, have someone repaint the walls in these rooms with mold-resistant paint. Keep out of the rooms that are being cleaned and painted.  Wash hands often. GET HELP IF:  You have make a whistling sound when breaking (wheeze), have shortness of breath, or have a cough even if taking medicine to prevent attacks.  The colored mucus you cough up (sputum) is thicker than usual.  The colored mucus you cough up changes from clear or white to yellow, green, gray, or bloody.  You have problems from the medicine you are taking such as:  A rash.  Itching.  Swelling.  Trouble breathing.  You need reliever medicines more than 2 3 times a  week.  Your peak flow measurement is still at 50 79% of your personal best after following the action plan for 1 hour. GET HELP RIGHT AWAY IF:   You seem to be worse and are not responding to medicine during an asthma attack.  You are short of breath even at rest.  You get short of breath when doing very little activity.  You have trouble eating, drinking, or talking.  You have chest pain.  You have a fast heartbeat.  Your  lips or fingernails start to turn blue.  You are lightheaded, dizzy, or faint.  Your peak flow is less than 50% of your personal best.  You have a fever or lasting symptoms for more than 2 3 days.  You have a fever and your symptoms suddenly get worse. MAKE SURE YOU:   Understand these instructions.  Will watch your condition.  Will get help right away if you are not doing well or get worse. Document Released: 06/16/2007 Document Revised: 10/18/2012 Document Reviewed: 07/27/2012 [facility] Patient Information 2014 [place], [place].   Emergency Department Resource Guide 1) Find a Doctor and Pay Out of Pocket Although you won't have to find out who is covered by your insurance plan, it is a good idea to ask around and get recommendations. You will then need to call the office and see if the doctor you have chosen will accept you as a new patient and what types of options they offer for patients who are self-pay. Some doctors offer discounts or will set up payment plans for their patients who do not have insurance, but you will need to ask so you aren't surprised when you get to your appointment.  2) Contact Your Local Health Department Not all health departments have doctors that can see patients for sick visits, but many do, so it is worth a call to see if yours does. If you don't know where your local health department is, you can check in your phone book. The CDC also has a tool to help you locate your state's health department, and many state websites also have listings of all of their local health departments.  3) Find a Walk-in Clinic If your illness is not likely to be very severe or complicated, you may want to try a walk in clinic. These are popping up all over the country in pharmacies, drugstores, and shopping centers. They're usually staffed by nurse practitioners or physician assistants that have been trained to treat common illnesses and complaints. They're usually fairly quick  and inexpensive. However, if you have serious medical issues or chronic medical problems, these are probably not your best option.  No Primary Care Doctor: - Call Health Connect at  [phone] - they can help you locate a primary care doctor that  accepts your insurance, provides certain services, etc. - Physician Referral Service- [phone]  Chronic Pain Problems: Organization         Address  Phone   Notes  [facility]  [phone] Patients need to be referred by their primary care doctor.   Medication Assistance: Organization         Address  Phone   Notes  [facility] Medication [facility] [address] [place] [place], [place] [address] [phone] --Must be a resident of [facility] -- Must have NO insurance coverage whatsoever (no Medicaid/ Medicare, etc.) -- The pt. MUST have a primary care doctor that directs their care regularly and follows them in  the community   Med[facility]ist  [phone]   [address]  [phone]    Agencies that provide inexpensive medical care: Organization         Address  Phone   Notes  [facility]  [phone]   [facility]    [phone]   [facility] [address] [place], [place] [address] [phone]   [facility] [address] [place] [phone]   Planned Parenthood    [phone]   [facility]    [phone]   [facility]  [address], [place] Phone:  [phone], Fax:  [phone] Hours of Operation:  9 am - 6 pm, M-F.  Also accepts Medicaid/Medicare and self-pay.  [facility]  [address], [place] Phone: [phone], Fax: [phone]. Hours of Operation:  8:30 am - 5:30 pm, M-F.  Also accepts Medicaid and self-pay.  [facility] [place] [address], [place] Phone: [phone]    [facility] [address] Natasha Bence [place], [place] [phone], Ext. [phone] Mondays & Thursdays: 7-9 AM.  First 15 patients are seen on a first come, first serve basis.    Medicaid-accepting [facility] Providers:  Organization         Address  Phone   Notes  [facility] [address], [place] [phone] Also accepts self-pay patients.  [facility] [address] Laurell Josephs [place], [place]  [phone]   [facility] [address], [place] [phone]   [facility] [address] [place] [phone]   Renaye Rakers [address], [place]   [phone] Only accepts [place] patients after they have their name applied to their card.   Self-Pay (no insurance) in [facility]:  Organization         Address  Phone   Notes  Sickle Cell Patients, [facility] [address] [place], [place] [phone]   [facility] [address] [place], [place] [phone]   [facility]  [address], [place] [phone]   [facility]/Dr. Osei-Bonsu  [address] West or [address], [place] [phone] Phone number for both [place] and [place] locations is the same.  [facility]gent [facility] and [facility] [address] [place] [phone]   [facility] [address] [place] or [address] [phone]   [facility] [address], [place] [phone], phone; [phone], fax Sees patients 1st and 3rd Saturday of every month.  Must not qualify for public or private insurance (i.e. Medicaid, Medicare, [place] Health Choice, Veterans' Benefits)  Household income should be no more than 200% of the poverty level The clinic cannot treat you if you are  pregnant or think you are pregnant  Sexually transmitted diseases are not treated at the clinic.    Dental Care: Organization         Address  Phone  Notes  [facility] Department of [facility] [address] [place], [place] [phone] Accepts children up to age 36 who are enrolled in [place]  or [place] Health Choice; pregnant women with a Medicaid card; and children who have applied for Medicaid or [place] Health Choice, but were declined, whose parents can pay a reduced fee at time of service.  [facility] Department of [facility]  [address], [place] [phone] Accepts children up to age 69 who are enrolled in [place] or [place] Health Choice; pregnant women with a Medicaid card; and children who have applied for Medicaid or  Health Choice, but were declined, whose parents can pay a reduced fee at time of service.  [facility]  [address] [place], [place] [phone] Patients are seen by appointment only. Walk-ins are not accepted. [facility] will see patients 47 years of age and older. Monday - Tuesday (8am-5pm) Most Wednesdays (8:30-5pm) $30 per visit, cash only  [facility] Adult Dental Access PRO[facility]  [address], [facility] [phone] Patients are seen by appointment only. Walk-ins are not accepted. [facility] will see patients 42 years of age and older. One Wednesday Evening (Monthly: Volunteer Based).  $30 per visit, cash only  [employer]  [phone] for adults; Children under age 58, call Graduate Pediatric Dentistry at [phone]. Children aged 35-14, please call [phone] to request a pediatric application.  Dental services are provided in all areas of dental care including fillings, crowns and bridges, complete and partial dentures, implants, gum treatment, root canals, and extractions. Preventive care is also provided. Treatment is provided to  both adults and children. Patients are selected via a lottery and there is often a waiting list.   [facility] [address], [place]  [phone] [email or web address]   [facility] [address] [place], [place] [phone], Ext. [phone] Second and Fourth Thursday of each month, opens at 6:30 AM; Clinic ends at 9 AM.  Patients are seen on a first-come first-served basis, and a limited number are seen during each clinic.   [facility]  [address] Ether Griffins [place], [place] [phone]   Eligibility Requirements You must have lived in [place], [place], or [place] counties for at least the last three months.   You cannot be eligible for state or federal sponsored [place], including [employer], [place], or [employer].   You generally cannot be eligible for healthcare insurance through your employer.    How to apply: Eligibility screenings are held every Tuesday and Wednesday afternoon from 1:00 pm until 4:00 pm. You do not need an appointment for the interview!  [facility] [address] [place], [place] [phone]   [facility] Health Department  [phone]   [facility]  [phone]   [facility] Health Department  [phone]    Behavioral Health Resources in the Community: Intensive Outpatient Programs Organization         Address  Phone  Notes  [facility] [address] [place], [place] [phone]   [facility] Outpatient [address] [place], [place] [phone]   [facility] [address] [place], [place]  [phone]   [facility] [address]  [place], [place] [phone] or [phone]   Substance Abuse Resources Organization         Address  Phone  Notes  [facility]  [phone]   [facility]  [phone]   [facility]  [phone]   [facility]  [phone]   Residential & Outpatient Substance Abuse Program  [phone]   Psychological Services Organization         Address  Phone  Notes  [facility]  [phone]- [phone]   [facility]  [phone]- [phone]   [facility] [address], [place] [phone] or [phone]    Mobile Crisis Teams Organization         Address  Phone  Notes  [facility], Mobile Crisis Care Unit  [phone]   [facility]  [address] [place], [place] [phone]   Doristine LocksSharon DeEsch [address] [place] [phone]    Self-Help/Support Groups Organization         Address  Phone             Notes  [facility] [place] - variety of support groups  336- [phone] Call for more information  Narcotics Anonymous (NA), [facility] [address], [place]  2 meetings at this location   Statisticianesidential Treatment Programs Organization         Address  Phone  Notes  [facility] [address],    [place]  [phone]   [facility]  [address] [place] [phone], [place], [place] [phone]   [facility] [address], [place] [phone] Admissions: 8am-3pm M-F  [facility] [address]    [place], [place] [phone]   [facility] [address], [place], [place] [phone]   [facility] [address]  [place], [place] [phone]   [facility] - Intensive Outpatient [address], [place], [place] [phone]   [address] [facility] ([facility] [address]  [place], [place] [phone] or [phone]   [facility] ([facility]) [address] [place], [place] [phone] Accepts Medicaid  [facility] [address]  [place] [phone] Substance Abuse/Addiction Treatment   [facility] Behavioral Health Resources Organization         Address  Phone  Notes  [facility]  [phone](888) [phone]   Angie FavaJulie Brannon, PhD [address] [place], [place]   [phone]) [phone] or [phone](336) [phone]   [facility]   [address] [place], [place] [phone](336) [phone]   [facility] [address], [place], [place] [phone]) [phone] Insurance/Medicaid/sponsorship through [facility] [place] [facility]  [facility] [address]                                    [place], [place] [phone]) [phone] Therapy/tele-psych/case  [facility] [address]   [place], [place] [phone]    Dr. Lolly MustacheArfeen  [phone]   [facility]  [employer] [facility] 1) [address], [place] 2) [address], [place] 3)  [address], [place] [phone](336) [phone]  [phone]([phone]   [facility] Child Abuse Hotline [phone](336) [phone] or [phone](336) [phone] (After Hours)

## 2013-05-21 NOTE — ED Notes (Signed)
Pt ambulatory to exam room with steady gait.  

## 2013-05-21 NOTE — ED Notes (Signed)
Pt ambulated around hall on pulse ox meter. Pt had O2 sats 100% with HR 100-118 while ambulating. Pt tolerated well with no acute distress.

## 2013-05-21 NOTE — ED Provider Notes (Signed)
CSN: [id number]     Arrival date & time 05/21/13  1932 History  This chart was scribed for non-physician practitioner Raymon MuttonMarissa Clifford Coudriet working with Juliet RudeNathan R. Rubin PayorPickering, MD by Carl Bestelina Holson, ED Scribe. This patient was seen in room WTR9/WTR9 and the patient's care was started at 9:23 PM.   No chief complaint on file.  The history is provided by the patient. No language interpreter was used.   HPI Comments: Nicole White is a 23 y.o. female with a history of asthma who presents to the Emergency Department complaining of constant SOB that started yesterday while her ceiling fan was on.  She states that her symptoms persisted today but worsened today at 11 AM this morning.  The patient states that she has received 2 breathing treatments today and used her albuterol inhaler with no relief to her symptoms.  She states that she has run out of her albuterol.  She states that she was given a Prednisone taper but did not complete the course.  The patient states that since being in the ED, her symptoms have alleviated completely.  She denies any difficulty breathing, cough, fever, sore throat, chest pain, SOB, and numbness and tingling as associated symptoms.  She states that she has experienced these symptoms in the past when she experiences asthma exacerbations.  The patient [facility]. Denied long travels, denied birth control.    Past Medical History  Diagnosis Date  . Asthma    Past Surgical History  Procedure Laterality Date  . No past surgeries     Family History  Problem Relation Age of Onset  . Asthma Father   . Autism Brother    History  Substance Use Topics  . Smoking status: Never Smoker   . Smokeless tobacco: Never Used  . Alcohol Use: No   OB History   Grav Para Term Preterm Abortions TAB SAB Ect Mult Living                 Review of Systems  Constitutional: Negative for fever.  HENT: Negative for sore throat  and trouble swallowing.   Respiratory: Negative for cough and shortness of breath.   Cardiovascular: Negative for chest pain.  Gastrointestinal: Negative for nausea and vomiting.  Neurological: Negative for numbness.  All other systems reviewed and are negative.     Allergies  Peanut-containing drug products and Coconut flavor  Home Medications   Prior to Admission medications   Medication Sig Start Date End Date Taking? Authorizing Provider  albuterol (PROVENTIL HFA;VENTOLIN HFA) 108 (90 BASE) MCG/ACT inhaler Inhale 2 puffs into the lungs every 4 (four) hours as needed for wheezing or shortness of breath. 04/03/13   Kristen N Ward, DO  albuterol (PROVENTIL HFA;VENTOLIN HFA) 108 (90 BASE) MCG/ACT inhaler Inhale 2 puffs into the lungs every 6 (six) hours as needed for wheezing or shortness of breath. 04/26/13   Lauren Doretha ImusM Parker, PA-C  azithromycin (ZITHROMAX) 250 MG tablet Take 4 tablets (1,000 mg total) by mouth once. 04/20/13   Phill MutterPeter S Dammen, PA-C  cefixime (SUPRAX) 400 MG tablet Take 1 tablet (400 mg total) by mouth daily. Once only 04/20/13   Phill MutterPeter S Dammen, PA-C  doxycycline (VIBRAMYCIN) 100 MG capsule Take 1 capsule (100 mg total) by mouth 2 (two) times daily. 04/20/13   Phill MutterPeter S Dammen, PA-C  doxycycline (VIBRAMYCIN) 100 MG capsule Take 1 capsule (100 mg total) by mouth 2 (two) times daily. 04/26/13  Lauren Doretha Imus, PA-C  fexofenadine (ALLEGRA) 180 MG tablet Take 180 mg by mouth daily as needed for allergies or rhinitis.    Historical Provider, MD  metroNIDAZOLE (FLAGYL) 500 MG tablet Take 1 tablet (500 mg total) by mouth 2 (two) times daily. 04/26/13   Lauren Doretha Imus, PA-C  predniSONE (DELTASONE) 20 MG tablet Take 2 tablets (40 mg total) by mouth daily. Take 40 mg by mouth daily for 3 days, then 20mg  by mouth daily for 3 days, then 10mg  daily for 3 days 04/26/13   Clabe Seal, PA-C   Triage Vitals: BP 127/81  Pulse 116  Temp(Src) 98.4 F (36.9 C) (Oral)  Resp 18  Ht 5' (1.524 m)   Wt 152 lb (68.947 kg)  BMI 29.69 kg/m2  SpO2 100%  LMP 02/19/2013  Physical Exam  Nursing note and vitals reviewed. Constitutional: She is oriented to person, place, and time. She appears well-developed and well-nourished.  When this provider walked into the room patient was sitting upright comfortable in the chair talking on the phone with no sign of distress noted.   HENT:  Head: Normocephalic and atraumatic.  Eyes: Conjunctivae and EOM are normal. Right eye exhibits no discharge. Left eye exhibits no discharge.  Neck: Normal range of motion. Neck supple. No tracheal deviation present.  Negative neck stiffness Negative nuchal rigidity Negative cervical lymphadenopathy Negative meningeal signs  Cardiovascular: Normal rate, regular rhythm and normal heart sounds.  Exam reveals no friction rub.   No murmur heard. Pulses:      Radial pulses are 2+ on the right side, and 2+ on the left side.  Cap refill less than 3 seconds  Pulmonary/Chest: Effort normal and breath sounds normal. No respiratory distress. She has no wheezes. She has no rales. She exhibits no tenderness.  Patient is able to speak in full sentences without difficulty Negative use of accessory muscles Negative stridor Negative pain upon palpation to the chest wall  Musculoskeletal: Normal range of motion.  Full ROM to upper and lower extremities without difficulty noted, negative ataxia noted.  Lymphadenopathy:    She has no cervical adenopathy.  Neurological: She is alert and oriented to person, place, and time. No cranial nerve deficit. She exhibits normal muscle tone. Coordination normal.  Skin: Skin is warm and dry.  Psychiatric: She has a normal mood and affect. Her behavior is normal.    ED Course  Procedures (including critical care time)  DIAGNOSTIC STUDIES: Oxygen Saturation is 100% on room air, normal by my interpretation.    COORDINATION OF CARE: 9:27 PM- Discussed obtaining a chest x-ray and the  patient agreed to the treatment plan.  10:22 PM Patient standing up at the desk - patient wants to leave and does not want to stay. Reported that she has a bus to catch at 10:30 PM. Patient reported that she needs to leave right now.   Results for orders placed during the hospital encounter of 05/21/13  CBC      Result Value Ref Range   WBC 8.9  4.0 - 10.5 K/uL   RBC 5.48 (*) 3.87 - 5.11 MIL/uL   Hemoglobin 15.0  12.0 - 15.0 g/dL   HCT 16.1  09.6 - 04.5 %   MCV 79.4  78.0 - 100.0 fL   MCH 27.4  26.0 - 34.0 pg   MCHC 34.5  30.0 - 36.0 g/dL   RDW 40.9  81.1 - 91.4 %   Platelets 251  150 - 400 K/uL  BASIC METABOLIC PANEL      Result Value Ref Range   Sodium 142  137 - 147 mEq/L   Potassium 3.9  3.7 - 5.3 mEq/L   Chloride 103  96 - 112 mEq/L   CO2 26  19 - 32 mEq/L   Glucose, Bld 105 (*) 70 - 99 mg/dL   BUN 9  6 - 23 mg/dL   Creatinine, Ser 1.610.82  0.50 - 1.10 mg/dL   Calcium 9.8  8.4 - 09.610.5 mg/dL   GFR calc non Af Amer >90  >90 mL/min   GFR calc Af Amer >90  >90 mL/min  PREGNANCY, URINE      Result Value Ref Range   Preg Test, Ur NEGATIVE  NEGATIVE    Labs Review Labs Reviewed  CBC - Abnormal; Notable for the following:    RBC 5.48 (*)    All other components within normal limits  BASIC METABOLIC PANEL - Abnormal; Notable for the following:    Glucose, Bld 105 (*)    All other components within normal limits  PREGNANCY, URINE    Imaging Review Dg Chest 2 View  05/21/2013   CLINICAL DATA:  Cough  EXAM: CHEST  2 VIEW  COMPARISON:  02/13/2013  FINDINGS: The heart size and mediastinal contours are within normal limits. Both lungs are clear. The visualized skeletal structures are unremarkable.  IMPRESSION: No active cardiopulmonary disease.   Electronically Signed   By: Alcide CleverMark  Lukens M.D.   On: 05/21/2013 21:53     EKG Interpretation None      MDM   Final diagnoses:  Asthma    Filed Vitals:   05/21/13 1941 05/21/13 2200  BP: 127/81 117/75  Pulse: 116 100  Temp:  98.4 F (36.9 C) 97.8 F (36.6 C)  TempSrc: Oral Oral  Resp: 18 18  Height: 5' (1.524 m)   Weight: 152 lb (68.947 kg)   SpO2: 100% 100%   I personally performed the services described in this documentation, which was scribed in my presence. The recorded information has been reviewed and is accurate.  CBC negative elevated white blood cell count noted. BMP kidney functioning well. Urine pregnancy negative. Chest x-ray negative for acute cardiopulmonary disease. Patient appears well. Negative signs of respiratory distress. Patient talking on cell phone when this provider walked into the room to assess her. Patient is able to speak in full senses that difficulty or use of accessory muscles-negative stridor. Patient is given albuterol treatment while en route to the ED. Patient denied chest pain, shortness of breath, difficulty breathing upon assessment physical exam. Patient denied use of birth control. Patient has allergy to peanuts, but has used albuterol in the past without complications or side effects.  PERC score 0 - doubt PE. Patient presenting to the ED with asthma but has now resolved. Pulse ox 100% on room air. Upon arrival to the ED patient's heart rate was 116 beats per minute-patient just received albuterol treatment en route in EMS. Heart rate has decreased to 100 beats per minute while in ED setting. Patient stable, afebrile. Patient not septic appearing. Patient no sign of respiratory distress. Patient given albuterol inhaler while in ED setting. Patient discharged. Discharged with prednisone. Discussed with patient to rest and stay hydrated. [facility]. Discussed with patient to closely monitor symptoms and if symptoms are to worsen or change to report back to the ED - strict return instructions given.  Patient agreed  to plan of care, understood, all questions answered.   Raymon Mutton, PA-C 05/22/13 [address] PA-C 05/22/13 1452

## 2013-05-22 NOTE — ED Provider Notes (Signed)
Medical screening examination/treatment/procedure(s) were performed by non-physician practitioner and as supervising physician I was immediately available for consultation/collaboration.   EKG Interpretation None       Rishabh Rinkenberger R. Jaci Desanto, MD 05/22/13 2330 

## 2013-07-05 ENCOUNTER — Telehealth (HOSPITAL_BASED_OUTPATIENT_CLINIC_OR_DEPARTMENT_OTHER): Payer: Self-pay | Admitting: Emergency Medicine

## 2013-09-25 ENCOUNTER — Encounter (HOSPITAL_COMMUNITY): Payer: Self-pay | Admitting: Emergency Medicine

## 2013-09-25 ENCOUNTER — Emergency Department (HOSPITAL_COMMUNITY)
Admission: EM | Admit: 2013-09-25 | Discharge: 2013-09-25 | Discharge status: Against Medical Advice | Payer: Self-pay | Attending: Emergency Medicine | Admitting: Emergency Medicine

## 2013-09-25 DIAGNOSIS — J45909 Unspecified asthma, uncomplicated: Secondary | ICD-10-CM | POA: Insufficient documentation

## 2013-09-25 NOTE — ED Notes (Signed)
Per EMS pt has asthma and with the change in the weather it is acting up  Pt is out of her inhaler  Pt states it was worse today  Pt was given an albuterol  treatment on the way to the hospital with relief

## 2013-09-25 NOTE — ED Notes (Signed)
Bed: WTR5 Expected date: 09/25/13 Expected time: 6:44 AM Means of arrival: Ambulance Comments: asthma

## 2013-09-25 NOTE — ED Notes (Signed)
Pt states she wants to leave due to working today and cannot wait any longer.

## 2013-09-28 ENCOUNTER — Encounter (HOSPITAL_COMMUNITY): Payer: Self-pay | Admitting: Emergency Medicine

## 2013-09-28 ENCOUNTER — Emergency Department (HOSPITAL_COMMUNITY)
Admission: EM | Admit: 2013-09-28 | Discharge: 2013-09-28 | Disposition: A | Discharge status: Home / Self Care | Payer: Self-pay | Attending: Emergency Medicine | Admitting: Emergency Medicine

## 2013-09-28 DIAGNOSIS — Z79899 Other long term (current) drug therapy: Secondary | ICD-10-CM | POA: Insufficient documentation

## 2013-09-28 DIAGNOSIS — Z760 Encounter for issue of repeat prescription: Secondary | ICD-10-CM | POA: Insufficient documentation

## 2013-09-28 DIAGNOSIS — J45901 Unspecified asthma with (acute) exacerbation: Secondary | ICD-10-CM | POA: Insufficient documentation

## 2013-09-28 DIAGNOSIS — R062 Wheezing: Secondary | ICD-10-CM | POA: Insufficient documentation

## 2013-09-28 MED ORDER — PREDNISONE 20 MG PO TABS: 40.0000 mg | ORAL_TABLET | Freq: Every day | ORAL | Status: DC

## 2013-09-28 MED ORDER — ALBUTEROL SULFATE HFA 108 (90 BASE) MCG/ACT IN AERS: 1.0000 | INHALATION_SPRAY | Freq: Four times a day (QID) | RESPIRATORY_TRACT | Status: DC | PRN

## 2013-09-28 MED ORDER — ALBUTEROL SULFATE HFA 108 (90 BASE) MCG/ACT IN AERS
2.0000 | INHALATION_SPRAY | Freq: Once | RESPIRATORY_TRACT | Status: AC
  Administered 2013-09-28: 2 via RESPIRATORY_TRACT
  Filled 2013-09-28: qty 6.7

## 2013-09-28 NOTE — ED Notes (Signed)
Per EMS-Pt called EMS with c/o wheezing and shortness of breath x 11 hour. Pt was given Albuterol treatment x 1. Wheezing resolved

## 2013-09-28 NOTE — ED Provider Notes (Signed)
CSN: [id number]     Arrival date & time 09/28/13  1442 History  This chart was scribed for non-physician practitioner, Emilia Beck, PA-C working with Flint Melter, MD by Luisa Dago, ED scribe. This patient was seen in room WTR8/WTR8 and the patient's care was started at 3:12 PM.    Chief Complaint  Patient presents with  . Wheezing    wheezing resoved after Albuteral   . Shortness of Breath   The history is provided by the patient. No language interpreter was used.   HPI Comments: Nicole White is a 23 y.o. female who presents to the Emergency Department complaining of wheezing that started PTA. Pt was brought in by EMS and she was given an Albuterol 5 mg treatment in route, which resolved her symptoms. She is also requesting to be prescribed an albuterol inhaler. Denies any fever, chills, nausea, emesis, abdominal pain, SOB, or chest pain.  Past Medical History  Diagnosis Date  . Asthma    Past Surgical History  Procedure Laterality Date  . No past surgeries     Family History  Problem Relation Age of Onset  . Asthma Father   . Autism Brother    History  Substance Use Topics  . Smoking status: Never Smoker   . Smokeless tobacco: Never Used  . Alcohol Use: No   OB History   Grav Para Term Preterm Abortions TAB SAB Ect Mult Living                 Review of Systems  Constitutional: Negative for fever, chills and diaphoresis.  HENT: Negative for congestion and sore throat.   Eyes: Negative for visual disturbance.  Respiratory: Positive for shortness of breath and wheezing.   Gastrointestinal: Negative for nausea, vomiting and abdominal pain.  Genitourinary: Negative for dysuria.  Musculoskeletal: Negative for arthralgias.  Skin: Negative for rash.  Neurological: Negative for light-headedness and headaches.      Allergies  Peanut-containing drug products and Coconut flavor  Home Medications   Prior to Admission medications   Medication Sig Start  Date End Date Taking? Authorizing Provider  albuterol (PROVENTIL HFA;VENTOLIN HFA) 108 (90 BASE) MCG/ACT inhaler Inhale 2 puffs into the lungs every 4 (four) hours as needed for wheezing or shortness of breath. 04/03/13   Layla Maw Ward, DO   Triage vitals:SpO2 100%  LMP 08/25/2013  Physical Exam  Nursing note and vitals reviewed. Constitutional: She is oriented to person, place, and time. She appears well-developed and well-nourished. No distress.  HENT:  Head: Normocephalic and atraumatic.  Right Ear: External ear normal.  Left Ear: External ear normal.  Nose: Nose normal.  Mouth/Throat: Oropharynx is clear and moist.  Eyes: Conjunctivae and EOM are normal.  Normal appearance  Neck: Normal range of motion. Neck supple.  Cardiovascular: Normal rate, regular rhythm and normal heart sounds.   Pulmonary/Chest: Effort normal and breath sounds normal. No stridor. No respiratory distress. She has no wheezes. She has no rales.  Abdominal: Soft. She exhibits no distension.  Musculoskeletal: Normal range of motion.  Neurological: She is alert and oriented to person, place, and time. She has normal strength.  Skin: Skin is warm and dry. She is not diaphoretic. No erythema.  Psychiatric: She has a normal mood and affect. Her behavior is normal.    ED Course  Procedures (including critical care time)  DIAGNOSTIC STUDIES: Oxygen Saturation is 100% on RA, normal by my interpretation.    COORDINATION OF CARE: 3:16 PM- Will discharge pt  with a prescription for an albuterol inhaler. Pt advised of plan for treatment and pt agrees.   MDM   Final diagnoses:  Asthma exacerbation    Patient no longer wheezing and will be discharged with prednisone and inhaler. Vitals stable and patient afebrile.   I personally performed the services described in this documentation, which was scribed in my presence. The recorded information has been reviewed and is accurate.    Emilia Beck,  PA-C 09/28/13 1739

## 2013-09-28 NOTE — Discharge Instructions (Signed)
Use inhaler as needed for wheezing. Take prednisone as directed until gone. Refer to attached documents for more information.

## 2013-09-28 NOTE — ED Notes (Signed)
Pt reports that she called EMS because she was short of breath and wheezing and did not have her inhaler. C/o shortness of breath x 1 week. Using Primatine with some relief. Currently denies shortness of breath. No audible wheezing

## 2013-10-01 NOTE — ED Provider Notes (Signed)
Medical screening examination/treatment/procedure(s) were performed by non-physician practitioner and as supervising physician I was immediately available for consultation/collaboration.  Tuan Tippin L Vivienne Sangiovanni, MD 10/01/13 1029 

## 2014-04-16 ENCOUNTER — Emergency Department (HOSPITAL_COMMUNITY)
Admission: EM | Admit: 2014-04-16 | Discharge: 2014-04-16 | Disposition: A | Discharge status: Home / Self Care | Payer: Self-pay | Attending: Emergency Medicine | Admitting: Emergency Medicine

## 2014-04-16 ENCOUNTER — Encounter (HOSPITAL_COMMUNITY): Payer: Self-pay | Admitting: *Deleted

## 2014-04-16 DIAGNOSIS — Z3202 Encounter for pregnancy test, result negative: Secondary | ICD-10-CM | POA: Insufficient documentation

## 2014-04-16 DIAGNOSIS — J45901 Unspecified asthma with (acute) exacerbation: Secondary | ICD-10-CM | POA: Insufficient documentation

## 2014-04-16 DIAGNOSIS — Z79899 Other long term (current) drug therapy: Secondary | ICD-10-CM | POA: Insufficient documentation

## 2014-04-16 DIAGNOSIS — Z7952 Long term (current) use of systemic steroids: Secondary | ICD-10-CM | POA: Insufficient documentation

## 2014-04-16 LAB — POC URINE PREG, ED: Preg Test, Ur: NEGATIVE

## 2014-04-16 MED ORDER — ALBUTEROL SULFATE HFA 108 (90 BASE) MCG/ACT IN AERS: 1.0000 | INHALATION_SPRAY | Freq: Four times a day (QID) | RESPIRATORY_TRACT | Status: DC | PRN

## 2014-04-16 MED ORDER — IPRATROPIUM-ALBUTEROL 0.5-2.5 (3) MG/3ML IN SOLN
3.0000 mL | Freq: Once | RESPIRATORY_TRACT | Status: AC
  Administered 2014-04-16: 3 mL via RESPIRATORY_TRACT
  Filled 2014-04-16: qty 3

## 2014-04-16 MED ORDER — ALBUTEROL SULFATE HFA 108 (90 BASE) MCG/ACT IN AERS
2.0000 | INHALATION_SPRAY | Freq: Once | RESPIRATORY_TRACT | Status: AC
  Administered 2014-04-16: 2 via RESPIRATORY_TRACT
  Filled 2014-04-16: qty 6.7

## 2014-04-16 MED ORDER — DEXAMETHASONE 2 MG PO TABS
10.0000 mg | ORAL_TABLET | Freq: Once | ORAL | Status: AC
  Administered 2014-04-16: 10 mg via ORAL
  Filled 2014-04-16: qty 2
  Filled 2014-04-16: qty 1

## 2014-04-16 NOTE — Progress Notes (Signed)
Patient left without receiving community resources, [facility]

## 2014-04-16 NOTE — ED Notes (Signed)
Pt in by ems, lost her inhaler earlier today, sob x5 hours. EMS gave 5mg  albuterol for wheezing in all lobes. Wheezing improved after treatment.

## 2014-04-16 NOTE — Discharge Instructions (Signed)

## 2014-04-16 NOTE — ED Provider Notes (Addendum)
CSN: [id number]     Arrival date & time 04/16/14  1720 History   First MD Initiated Contact with Patient 04/16/14 1750     Chief Complaint  Patient presents with  . Asthma  . Shortness of Breath     (Consider location/radiation/quality/duration/timing/severity/associated sxs/prior Treatment) Patient is a 24 y.o. female presenting with asthma and shortness of breath. The history is provided by the patient.  Asthma This is a new problem. The current episode started 6 to 12 hours ago. The problem occurs constantly. The problem has not changed since onset.Associated symptoms include shortness of breath. Pertinent negatives include no chest pain, no abdominal pain and no headaches. Nothing aggravates the symptoms. Nothing relieves the symptoms. She has tried nothing for the symptoms. The treatment provided no relief.  Shortness of Breath Associated symptoms: cough and wheezing   Associated symptoms: no abdominal pain, no chest pain, no fever, no headaches, no neck pain and no vomiting     Past Medical History  Diagnosis Date  . Asthma    Past Surgical History  Procedure Laterality Date  . No past surgeries     Family History  Problem Relation Age of Onset  . Asthma Father   . Autism Brother    History  Substance Use Topics  . Smoking status: Never Smoker   . Smokeless tobacco: Never Used  . Alcohol Use: No   OB History    No data available     Review of Systems  Constitutional: Negative for fever and fatigue.  HENT: Negative for congestion and drooling.   Eyes: Negative for pain.  Respiratory: Positive for cough, shortness of breath and wheezing.   Cardiovascular: Negative for chest pain.  Gastrointestinal: Negative for nausea, vomiting, abdominal pain and diarrhea.  Genitourinary: Negative for dysuria and hematuria.  Musculoskeletal: Negative for back pain, gait problem and neck pain.  Skin: Negative for color change.  Neurological: Negative for dizziness and headaches.   Hematological: Negative for adenopathy.  Psychiatric/Behavioral: Negative for behavioral problems.  All other systems reviewed and are negative.     Allergies  Peanut-containing drug products and Coconut flavor  Home Medications   Prior to Admission medications   Medication Sig Start Date End Date Taking? Authorizing Provider  albuterol (PROVENTIL HFA;VENTOLIN HFA) 108 (90 BASE) MCG/ACT inhaler Inhale 2 puffs into the lungs every 4 (four) hours as needed for wheezing or shortness of breath. 04/03/13  Yes Kristen N Ward, DO  diphenhydrAMINE (SOMINEX) 25 MG tablet Take 50 mg by mouth daily as needed for allergies or sleep.   Yes Historical Provider, MD  PREDNISONE, PAK, PO Take by mouth. Dose pack.   Yes Historical Provider, MD  Tetrahydrozoline HCl (VISINE OP) Apply 1-2 drops to eye daily as needed (allergies.).   Yes Historical Provider, MD  albuterol (PROVENTIL HFA;VENTOLIN HFA) 108 (90 BASE) MCG/ACT inhaler Inhale 1-2 puffs into the lungs every 6 (six) hours as needed for wheezing or shortness of breath. Patient not taking: Reported on 04/16/2014 09/28/13   Emilia BeckKaitlyn Szekalski, PA-C  predniSONE (DELTASONE) 20 MG tablet Take 2 tablets (40 mg total) by mouth daily. Patient not taking: Reported on 04/16/2014 09/28/13   Emilia BeckKaitlyn Szekalski, PA-C   BP 128/78 mmHg  Pulse 100  Temp(Src) 98.3 F (36.8 C) (Oral)  Resp 18  SpO2 99%  LMP 02/23/2014 Physical Exam  Constitutional: She is oriented to person, place, and time. She appears well-developed and well-nourished.  HENT:  Head: Normocephalic.  Mouth/Throat: Oropharynx is clear and moist. No  oropharyngeal exudate.  Eyes: Conjunctivae and EOM are normal. Pupils are equal, round, and reactive to light.  Neck: Normal range of motion. Neck supple.  Cardiovascular: Normal rate, regular rhythm, normal heart sounds and intact distal pulses.  Exam reveals no gallop and no friction rub.   No murmur heard. Pulmonary/Chest: Effort normal and breath  sounds normal. No respiratory distress. She has no wheezes.  Abdominal: Soft. Bowel sounds are normal. There is no tenderness. There is no rebound and no guarding.  Musculoskeletal: Normal range of motion. She exhibits no edema or tenderness.  Neurological: She is alert and oriented to person, place, and time.  Skin: Skin is warm and dry.  Psychiatric: She has a normal mood and affect. Her behavior is normal.  Nursing note and vitals reviewed.   ED Course  Procedures (including critical care time) Labs Review Labs Reviewed  POC URINE PREG, ED    Imaging Review No results found.   EKG Interpretation None      MDM   Final diagnoses:  Asthma exacerbation    6:09 PM 24 y.o. female with a history of asthma who presents with wheezing and mild shortness of breath which has been worsening since she lost her inhaler this morning. She denies any significant cough or fever. She has been using her inhaler slightly more than normal. She is afebrile and vital signs are unremarkable here. She got a breathing treatment prior to my evaluation and has clear lung sounds on my exam. We'll give her a dose of Decadron here and a prescription for a new inhaler.  6:11 PM: Pt appears well.  I have discussed the diagnosis/risks/treatment options with the patient and believe the pt to be eligible for discharge home to follow-up with wellness center as needed. We also discussed returning to the ED immediately if new or worsening sx occur. We discussed the sx which are most concerning (e.g., worsening sob, fever) that necessitate immediate return. Medications administered to the patient during their visit and any new prescriptions provided to the patient are listed below.  Medications given during this visit Medications  dexamethasone (DECADRON) tablet 10 mg (not administered)  albuterol (PROVENTIL HFA;VENTOLIN HFA) 108 (90 BASE) MCG/ACT inhaler 2 puff (not administered)  ipratropium-albuterol (DUONEB)  0.5-2.5 (3) MG/3ML nebulizer solution 3 mL (3 mLs Nebulization Given 04/16/14 1740)    New Prescriptions   ALBUTEROL (PROVENTIL HFA;VENTOLIN HFA) 108 (90 BASE) MCG/ACT INHALER    Inhale 1-2 puffs into the lungs every 6 (six) hours as needed for wheezing or shortness of breath.     Purvis Sheffield, MD 04/16/14 1820  Purvis Sheffield, MD 04/16/14 [phone]

## 2014-06-01 ENCOUNTER — Encounter (HOSPITAL_COMMUNITY): Payer: Self-pay | Admitting: Emergency Medicine

## 2014-06-01 ENCOUNTER — Emergency Department (HOSPITAL_COMMUNITY)
Admission: EM | Admit: 2014-06-01 | Discharge: 2014-06-01 | Disposition: A | Discharge status: Home / Self Care | Payer: Self-pay | Attending: Emergency Medicine | Admitting: Emergency Medicine

## 2014-06-01 DIAGNOSIS — J4521 Mild intermittent asthma with (acute) exacerbation: Secondary | ICD-10-CM | POA: Insufficient documentation

## 2014-06-01 DIAGNOSIS — Z791 Long term (current) use of non-steroidal anti-inflammatories (NSAID): Secondary | ICD-10-CM | POA: Insufficient documentation

## 2014-06-01 DIAGNOSIS — Z79899 Other long term (current) drug therapy: Secondary | ICD-10-CM | POA: Insufficient documentation

## 2014-06-01 DIAGNOSIS — J4520 Mild intermittent asthma, uncomplicated: Secondary | ICD-10-CM

## 2014-06-01 DIAGNOSIS — Z7951 Long term (current) use of inhaled steroids: Secondary | ICD-10-CM | POA: Insufficient documentation

## 2014-06-01 MED ORDER — ALBUTEROL SULFATE HFA 108 (90 BASE) MCG/ACT IN AERS
2.0000 | INHALATION_SPRAY | RESPIRATORY_TRACT | Status: DC | PRN
  Filled 2014-06-01: qty 6.7

## 2014-06-01 MED ORDER — PREDNISONE 20 MG PO TABS: 60.0000 mg | ORAL_TABLET | Freq: Every day | ORAL | Status: DC

## 2014-06-01 NOTE — ED Notes (Signed)
Pt arrived to the ED with a complaint of shortness of breath.  Pt has been expereincing shortness of breath for a couple of weeks.  Pt has used her inhaler 10 times since 1100 hrs this am.

## 2014-06-01 NOTE — Discharge Instructions (Signed)

## 2014-06-01 NOTE — ED Provider Notes (Signed)
CSN: [id number]     Arrival date & time 06/01/14  2009 History   This chart was scribed for a non-physician practitioner, Elpidio AnisShari Devlin Mcveigh, PA-C working with Eber HongBrian Miller, MD by [place] Peace, ED Scribe. The patient was seen in [facility]. The patient's care was started at 9:34 PM.    Chief Complaint  Patient presents with  . Shortness of Breath      Patient is a 24 y.o. female presenting with shortness of breath. The history is provided by the patient. No language interpreter was used.  Shortness of Breath Associated symptoms: no fever   HPI Comments: Nicole White is a 24 y.o. female who arrived via EMS presents to the Emergency Department complaining of SOB over the past 2 weeks. Pt reports that she has used her inhaler about 10 times since 11:00 this morning. She notes some improvement in symptoms with inhaler. Pt does not have a PCP that she follows us with regularly.    Past Medical History  Diagnosis Date  . Asthma    Past Surgical History  Procedure Laterality Date  . No past surgeries     Family History  Problem Relation Age of Onset  . Asthma Father   . Autism Brother    History  Substance Use Topics  . Smoking status: Never Smoker   . Smokeless tobacco: Never Used  . Alcohol Use: No   OB History    No data available     Review of Systems  Constitutional: Negative for fever.  Respiratory: Positive for shortness of breath.       Allergies  Peanut-containing drug products; Shellfish allergy; and Coconut flavor  Home Medications   Prior to Admission medications   Medication Sig Start Date End Date Taking? Authorizing Provider  albuterol (PROVENTIL HFA;VENTOLIN HFA) 108 (90 BASE) MCG/ACT inhaler Inhale 1-2 puffs into the lungs every 6 (six) hours as needed for wheezing or shortness of breath. 04/16/14  Yes Purvis SheffieldForrest Harrison, MD  naproxen sodium (ANAPROX) 220 MG tablet Take 220 mg by mouth 2 (two) times daily as needed (pain, headache).   Yes Historical Provider,  MD  diphenhydrAMINE (SOMINEX) 25 MG tablet Take 50 mg by mouth daily as needed for allergies or sleep.    Historical Provider, MD  predniSONE (DELTASONE) 20 MG tablet Take 2 tablets (40 mg total) by mouth daily. Patient not taking: Reported on 04/16/2014 09/28/13   Emilia BeckKaitlyn Szekalski, PA-C  PREDNISONE, PAK, PO Take by mouth. Dose pack.    Historical Provider, MD  Tetrahydrozoline HCl (VISINE OP) Apply 1-2 drops to eye daily as needed (allergies.).    Historical Provider, MD   BP 112/72 mmHg  Pulse 93  Temp(Src) 98.1 F (36.7 C) (Oral)  Resp 20  Ht 5' (1.524 m)  Wt 165 lb (74.844 kg)  BMI 32.22 kg/m2  SpO2 98%  LMP  (LMP Unknown) Physical Exam  Constitutional: She is oriented to person, place, and time. She appears well-developed and well-nourished. No distress.  HENT:  Head: Normocephalic and atraumatic.  Eyes: Conjunctivae and EOM are normal.  Neck: Neck supple. No tracheal deviation present.  Cardiovascular: Normal rate and regular rhythm.   No murmur heard. Pulmonary/Chest: Effort normal. No respiratory distress. She has no wheezes.  Musculoskeletal: Normal range of motion.  Neurological: She is alert and oriented to person, place, and time.  Skin: Skin is warm and dry.  Psychiatric: She has a normal mood and affect. Her behavior is normal.  Nursing note and vitals reviewed.  ED Course  Procedures (including critical care time) Labs Review Labs Reviewed - No data to display  Imaging Review No results found.   EKG Interpretation None     Medications - No data to display  9:35 PM- Treatment plan was discussed with patient who verbalizes understanding and agrees.   MDM   Final diagnoses:  None    1. Asthma  The patient is subjectively asymptomatic with normal exam and normal vitals now. By report she has used her inhaler more than recommended today to gain control but is well appearing here. Will give Rx for steroids and refill inhaler.  I personally performed  the services described in this documentation, which was scribed in my presence. The recorded information has been reviewed and is accurate.    Elpidio Anis, PA-C 06/01/14 4540  Eber Hong, MD 06/02/14 [phone]

## 2014-06-01 NOTE — ED Notes (Signed)
Bed: GNF6WTR5 Expected date:  Expected time:  Means of arrival:  Comments: EMS 24 yo female SOB/hx asthma/used inhaler x10 with no relief-breath sounds now clear-sat 97% RA

## 2014-06-29 ENCOUNTER — Emergency Department (HOSPITAL_COMMUNITY)
Admission: EM | Admit: 2014-06-29 | Discharge: 2014-06-29 | Disposition: A | Discharge status: Home / Self Care | Payer: Self-pay | Attending: Emergency Medicine | Admitting: Emergency Medicine

## 2014-06-29 ENCOUNTER — Encounter (HOSPITAL_COMMUNITY): Payer: Self-pay | Admitting: Emergency Medicine

## 2014-06-29 DIAGNOSIS — R590 Localized enlarged lymph nodes: Secondary | ICD-10-CM | POA: Insufficient documentation

## 2014-06-29 DIAGNOSIS — J45909 Unspecified asthma, uncomplicated: Secondary | ICD-10-CM | POA: Insufficient documentation

## 2014-06-29 DIAGNOSIS — Z79899 Other long term (current) drug therapy: Secondary | ICD-10-CM | POA: Insufficient documentation

## 2014-06-29 DIAGNOSIS — J0300 Acute streptococcal tonsillitis, unspecified: Secondary | ICD-10-CM | POA: Insufficient documentation

## 2014-06-29 LAB — RAPID STREP SCREEN (MED CTR MEBANE ONLY): STREPTOCOCCUS, GROUP A SCREEN (DIRECT): POSITIVE — AB

## 2014-06-29 MED ORDER — DEXAMETHASONE SODIUM PHOSPHATE 10 MG/ML IJ SOLN
20.0000 mg | Freq: Once | INTRAMUSCULAR | Status: AC
  Administered 2014-06-29: 20 mg via INTRAMUSCULAR
  Filled 2014-06-29: qty 2

## 2014-06-29 MED ORDER — LIDOCAINE VISCOUS 2 % MT SOLN
15.0000 mL | Freq: Once | OROMUCOSAL | Status: AC
  Administered 2014-06-29: 15 mL via OROMUCOSAL
  Filled 2014-06-29: qty 15

## 2014-06-29 MED ORDER — CLINDAMYCIN PHOSPHATE 600 MG/50ML IV SOLN
600.0000 mg | Freq: Once | INTRAVENOUS | Status: AC
  Administered 2014-06-29: 600 mg via INTRAVENOUS
  Filled 2014-06-29: qty 50

## 2014-06-29 MED ORDER — LIDOCAINE VISCOUS 2 % MT SOLN: 20.0000 mL | OROMUCOSAL | Status: DC | PRN

## 2014-06-29 MED ORDER — SODIUM CHLORIDE 0.9 % IV BOLUS (SEPSIS)
500.0000 mL | Freq: Once | INTRAVENOUS | Status: AC
  Administered 2014-06-29: 500 mL via INTRAVENOUS

## 2014-06-29 NOTE — ED Provider Notes (Signed)
CSN: [id number]     Arrival date & time 06/29/14  1144 History   First MD Initiated Contact with Patient 06/29/14 1145     Chief Complaint  Patient presents with  . Sore Throat     (Consider location/radiation/quality/duration/timing/severity/associated sxs/prior Treatment) The history is provided by the patient. No language interpreter was used.  Nicole White is a 24 year old female with a history of asthma who presents by EMS for worsening sore throat that she has had for over 2 weeks. She is taking Tylenol with minimal relief. She states it is worse with eating and swallowing. She denies any fever, chills, ear pain, headache, fatigue, cough, shortness of breath, chest pain, abdominal pain, nausea, vomiting. She denies any drooling. Past Medical History  Diagnosis Date  . Asthma    Past Surgical History  Procedure Laterality Date  . No past surgeries     Family History  Problem Relation Age of Onset  . Asthma Father   . Autism Brother    History  Substance Use Topics  . Smoking status: Never Smoker   . Smokeless tobacco: Never Used  . Alcohol Use: No   OB History    No data available     Review of Systems  Constitutional: Negative for fever and chills.  HENT: Positive for sore throat and trouble swallowing. Negative for drooling, facial swelling, rhinorrhea and voice change.   Gastrointestinal: Negative for nausea and vomiting.  All other systems reviewed and are negative.     Allergies  Peanut-containing drug products; Shellfish allergy; and Coconut flavor  Home Medications   Prior to Admission medications   Medication Sig Start Date End Date Taking? Authorizing Provider  albuterol (PROVENTIL HFA;VENTOLIN HFA) 108 (90 BASE) MCG/ACT inhaler Inhale 1-2 puffs into the lungs every 6 (six) hours as needed for wheezing or shortness of breath. 04/16/14  Yes Purvis Sheffield, MD  diphenhydramine-acetaminophen (TYLENOL PM) 25-500 MG TABS Take 1 tablet by mouth at bedtime as  needed (pain).   Yes Historical Provider, MD  lidocaine (XYLOCAINE) 2 % solution Use as directed 20 mLs in the mouth or throat as needed for mouth pain. 06/29/14   Ashmi Blas Patel-Mills, PA-C  predniSONE (DELTASONE) 20 MG tablet Take 3 tablets (60 mg total) by mouth daily. Patient not taking: Reported on 06/29/2014 06/01/14   Elpidio Anis, PA-C   BP 116/76 mmHg  Pulse 93  Temp(Src) 98.5 F (36.9 C) (Oral)  Resp 18  SpO2 99%  LMP  (LMP Unknown) Physical Exam  Constitutional: She is oriented to person, place, and time. She appears well-developed and well-nourished.  HENT:  Head: Normocephalic and atraumatic.  Right Ear: External ear normal.  Left Ear: External ear normal.  Mouth/Throat: Uvula is midline and mucous membranes are normal. No trismus in the jaw. No uvula swelling. Posterior oropharyngeal edema and posterior oropharyngeal erythema present. No oropharyngeal exudate.  Bilateral tonsillar edema with no draining abscess but tonsils are touching uvula. Patent airway. No uvula swelling. No tonsillar abscess. No tonsillar or oropharyngeal exudates.  Eyes: Conjunctivae are normal.  Neck: Normal range of motion. Neck supple.  Left-sided cervical lymphadenopathy.  Cardiovascular: Normal rate, regular rhythm and normal heart sounds.   Pulmonary/Chest: Effort normal and breath sounds normal. No accessory muscle usage. No respiratory distress. She has no decreased breath sounds. She has no wheezes.  No stridor.  Abdominal: Soft. There is no tenderness.  Musculoskeletal: Normal range of motion.  Neurological: She is alert and oriented to person, place, and time.  Skin:  Skin is warm and dry.  Nursing note and vitals reviewed.   ED Course  Procedures (including critical care time) Labs Review Labs Reviewed  RAPID STREP SCREEN (NOT AT [facility]) - Abnormal; Notable for the following:    Streptococcus, Group A Screen (Direct) POSITIVE (*)    All other components within normal limits    Imaging  Review No results found.   EKG Interpretation None      MDM   Final diagnoses:  Acute streptococcal tonsillitis  Patient presents for sore throat for over 2 weeks. On exam she has bilateral tonsillar edema with both tonsils touching uvula. No tonsillar or oropharyngeal exudates. No uvula swelling. Patent airway. No drooling or difficulty breathing. No facial or submandibular swelling. She is currently afebrile and in no acute distress. No wheezing or stridor on exam. Patient was able to tolerate PO fluids without drooling or difficulty breathing which I personally witnessed. Strep positive. I have given patient medications as listed below. Medications  dexamethasone (DECADRON) injection 20 mg (20 mg Intramuscular Given 06/29/14 1222)  clindamycin (CLEOCIN) IVPB 600 mg (0 mg Intravenous Stopped 06/29/14 1319)  sodium chloride 0.9 % bolus 500 mL (0 mLs Intravenous Stopped 06/29/14 1328)  lidocaine (XYLOCAINE) 2 % viscous mouth solution 15 mL (15 mLs Mouth/Throat Given 06/29/14 1331)  She is in no acute distress and oxygen is at 99% on room air. I gave the patient strict return precautions such as difficulty breathing, throat closing or swelling, drooling, inability to tolerate fluids. She can take Tylenol or Motrin if she develops a fever. I also prescribed viscous lidocaine for the throat. Patient verbally agrees with the plan.     Catha Gosselin, PA-C 06/29/14 1937  Bethann Berkshire, MD 07/02/14 1414

## 2014-06-29 NOTE — ED Notes (Signed)
Complaint of sore throat x2 weeks increasing into today. Tenderness around neck and trouble swallowing today. Coming from home.

## 2014-06-29 NOTE — Discharge Instructions (Signed)
Tonsillitis Return for inability to swallow liquids, throat swelling, or difficulty breathing. Take Tylenol or Motrin if fever develops. Tonsillitis is an infection of the throat. This infection causes the tonsils to become red, tender, and puffy (swollen). Tonsils are groups of tissue at the back of your throat. If bacteria caused your infection, antibiotic medicine will be given to you. Sometimes symptoms of tonsillitis can be relieved with the use of steroid medicine. If your tonsillitis is severe and happens often, you may need to get your tonsils removed (tonsillectomy). HOME CARE   Rest and sleep often.  Drink enough fluids to keep your pee (urine) clear or pale yellow.  While your throat is sore, eat soft or liquid foods like:  Soup.  Ice cream.  Instant breakfast drinks.  Eat frozen ice pops.  Gargle with a warm or cold liquid to help soothe the throat. Gargle with a water and salt mix. Mix 1/4 teaspoon of salt and 1/4 teaspoon of baking soda in 1 cup of water.  Only take medicines as told by your doctor.  If you are given medicines (antibiotics), take them as told. Finish them even if you start to feel better. GET HELP IF:  You have large, tender lumps in your neck.  You have a rash.  You cough up green, yellow-brown, or bloody fluid.  You cannot swallow liquids or food for 24 hours.  You notice that only one of your tonsils is swollen. GET HELP RIGHT AWAY IF:   You throw up (vomit).  You have a very bad headache.  You have a stiff neck.  You have chest pain.  You have trouble breathing or swallowing.  You have bad throat pain, drooling, or your voice changes.  You have bad pain not helped by medicine.  You cannot fully open your mouth.  You have redness, puffiness, or bad pain in the neck.  You have a fever. MAKE SURE YOU:   Understand these instructions.  Will watch your condition.  Will get help right away if you are not doing well or get  worse. Document Released: 06/16/2007 Document Revised: 01/02/2013 Document Reviewed: 06/16/2012 [facility] Patient Information 2015 [place], [place]. This information is not intended to replace advice given to you by your health care provider. Make sure you discuss any questions you have with your health care provider.  Emergency Department Resource Guide 1) Find a Doctor and Pay Out of Pocket Although you won't have to find out who is covered by your insurance plan, it is a good idea to ask around and get recommendations. You will then need to call the office and see if the doctor you have chosen will accept you as a new patient and what types of options they offer for patients who are self-pay. Some doctors offer discounts or will set up payment plans for their patients who do not have insurance, but you will need to ask so you aren't surprised when you get to your appointment.  2) Contact Your Local Health Department Not all health departments have doctors that can see patients for sick visits, but many do, so it is worth a call to see if yours does. If you don't know where your local health department is, you can check in your phone book. The CDC also has a tool to help you locate your state's health department, and many state websites also have listings of all of their local health departments.  3) Find a Walk-in Clinic If your illness is not  likely to be very severe or complicated, you may want to try a walk in clinic. These are popping up all over the country in pharmacies, drugstores, and shopping centers. They're usually staffed by nurse practitioners or physician assistants that have been trained to treat common illnesses and complaints. They're usually fairly quick and inexpensive. However, if you have serious medical issues or chronic medical problems, these are probably not your best option.  No Primary Care Doctor: - Call Health Connect at  [phone] - they can help you locate a primary care  doctor that  accepts your insurance, provides certain services, etc. - Physician Referral Service- [phone]  Chronic Pain Problems: Organization         Address  Phone   Notes  [facility]  [phone] Patients need to be referred by their primary care doctor.   Medication Assistance: Organization         Address  Phone   Notes  [facility] Medication [facility] [address] [place] [place], [place] [address] [phone] --Must be a resident of [facility] -- Must have NO insurance coverage whatsoever (no Medicaid/ Medicare, etc.) -- The pt. MUST have a primary care doctor that directs their care regularly and follows them in the community   [facility]  [phone]   [employer]  [phone]    Agencies that provide inexpensive medical care: Organization         Address  Phone   Notes  [facility]  [phone]   [facility]    [phone]   [facility] [address] [place], [place] [address] [phone]   [facility] [address] [place], [place] [phone]   Planned Parenthood    [phone]   [facility]    [phone]   [facility]  [address], [place] Phone:  [phone], Fax:  [phone] Hours of Operation:  9 am - 6 pm, M-F.  Also accepts Medicaid/Medicare and self-pay.  [facility]  [address], [place] Phone: [phone], Fax: [phone]. Hours of Operation:  8:30 am - 5:30 pm, M-F.  Also accepts Medicaid and self-pay.  [facility] [place] [address] [place] Phone: [phone]   [facility] [address] Natasha Bence [place], [place] [phone], Ext. [phone] Mondays & Thursdays: 7-9 AM.  First 15 patients are seen on a first come, first serve basis.    Medicaid-accepting [facility]  Providers:  Organization         Address  Phone   Notes  [facility] [address], [place] [phone] Also accepts self-pay patients.  [facility] [address] Laurell Josephs [place], [place]  [phone]   [facility] [address], [place] [phone]   [facility] [address] [place] [phone]   Renaye Rakers [address], [place]   [phone] Only accepts [place] Access [place] patients after they have their name applied to their card.   Self-Pay (no insurance) in [facility]:  Organization         Address  Phone   Notes  Sickle Cell Patients, [facility] [address] [place], [place] [phone]   Redge Gainer  [facility] Care [address] [place], [place] [phone]   [facility] [place]  [address],  [phone]   [facility]/Dr. Osei-Bonsu  [address], [place] or [address], [place] [phone] Phone number for both [place] and [place] locations is the same.  Urgent Medical and [facility] [address], [place] [phone]   [facility] [address] [place] or [address] [phone]   [facility] [address], [place] [phone], phone; [phone], fax Sees patients 1st and 3rd Saturday of every month.  Must not qualify for public or private insurance (i.e. Medicaid, Medicare, [place] Health Choice, Veterans' Benefits)  Household income should be no more than 200% of the poverty level The clinic cannot treat you if you are pregnant or think you are pregnant  Sexually transmitted diseases are not treated at the clinic.    Dental Care: Organization         Address  Phone  Notes  [facility] of [facility] [address] [place], [place] [phone] Accepts children up to age 8 who are enrolled in [place] or [place] Health Choice; pregnant women with a Medicaid card; and children who have applied for Medicaid or [place] Health Choice, but were declined, whose parents can pay a reduced fee at time of service.  [facility] Department of [facility]  [address], [place] [phone] Accepts children up to age 61 who are enrolled in [place] or [place] Health Choice; pregnant women with a Medicaid card; and children who have applied for Medicaid or  Health Choice, but were declined, whose parents can pay a reduced fee at time of service.  [facility]  [address] [place], [place] [phone] Patients are seen by appointment only. Walk-ins are not accepted. [facility] will see patients 67 years of age and older. Monday - Tuesday (8am-5pm) Most Wednesdays (8:30-5pm) $30 per visit, cash only  [facility] Adult [facility] Access [facility]  [address], [facility] [phone] Patients are seen by appointment only. Walk-ins are not accepted. [facility] will see patients 66 years of age and older. One Wednesday Evening (Monthly: Volunteer Based).  $30 per visit, cash only  [employer]  [phone] for adults; Children under age 6, call Graduate Pediatric Dentistry at [phone]. Children aged 45-14, please call [phone] to request a pediatric application.  Dental services are provided in all areas of dental care including fillings, crowns and bridges, complete and partial dentures, implants, gum treatment, root canals, and extractions. Preventive care is also provided. Treatment is provided to both adults and children. Patients are selected via a lottery and there is often a waiting list.   [facility] [address], [place]  [phone] [email or web address]   [facility]  [address] [place], [place] [phone], Ext. [phone] Second and Fourth Thursday of each month, opens at 6:30 AM; Clinic ends at 9 AM.  Patients are seen on a first-come first-served basis, and a limited number are seen during each clinic.   [facility]  [address] Ether Griffins [place], [place] [phone]   Eligibility Requirements You must have lived in [place], [place], or [place] counties for at least the last three  months.   You cannot be eligible for state or federal sponsored [place], including [employer], [place], or [employer].   You generally cannot be eligible for healthcare insurance through your employer.    How to apply: Eligibility screenings are held every Tuesday and Wednesday afternoon from 1:00 pm until 4:00 pm. You do not need an appointment for the interview!  [facility] [address], [place], [place] [phone]   [facility] Health Department  [phone]   [facility]  [phone]   [facility] Health Department  [phone]    Behavioral Health Resources in the Community: Intensive Outpatient Programs Organization         Address  Phone  Notes  [facility] Services [address], [place], [place] [phone]   [facility] Outpatient [address], [place], [place] [phone]   [facility] [address] [place], [place]  [phone]   [facility] Mental Health [address],  [place], [place] [phone] or [phone]   Substance Abuse Resources Organization         Address  Phone  Notes  [facility]  [phone]   [facility]  [phone]   [facility]  [phone]   Floydene Flock  [phone]   Residential & Outpatient Substance Abuse Program  [phone]   Psychological Services Organization         Address  Phone  Notes  [facility] Behavioral Health  [phone]    [facility] Services  [phone]   [facility] Mental Health [address] [place] [phone] or [phone]    Mobile Crisis Teams Organization         Address  Phone  Notes  [facility], Mobile Crisis Care Unit  [phone]   [facility]  [address] [place], [place] [phone]   Doristine Locks [address] [place] [phone]    Self-Help/Support Groups Organization         Address  Phone             Notes  [facility] [place] - variety of support groups  336- I7437963 Call for more information  Narcotics Anonymous (NA), [facility] [address], [employer] [place]  2 meetings at this location   Statistician         Address  Phone  Notes  [facility] [address],    [place]  [phone]   [facility]  [address], [place] [address], [place], [place] [phone]   [facility] [address] [place], [place] [phone] Admissions: 8am-3pm M-F  [facility] [address],    [place], [place] [phone]   [facility] [address] [place], [place], [place] [phone]   [facility] [address]  [place], [place] [phone]   [facility] - Intensive Outpatient [address] Laurell Joseph[address], [place], [place] [phone]   [facility] ([facility] [address] [place]  [place], [place] [phone] or [phone]   [facility] ([facility]) [address], [place], [place] [phone] Accepts Medicaid  [facility] [address],  [place] [phone] Substance Abuse/Addiction Treatment   [facility] Organization         Address  Phone  Notes  [facility]  [phone]   Angie Fava, PhD  [address], [place]   [phone] or 9415250854    [facility]   [address] [place], [place] [phone]   [facility] [address] [place], [place] [phone] Insurance/Medicaid/sponsorship through [facility] and Families [address]                                    [place], [place] [phone] Therapy/tele-psych/case  [facility] [address]   [place], [place] [phone]    Dr. Lolly Mustache  [phone]   [facility]  [employer] [facility] Dept. 1) [address] [place] 2) [address], [place] 3)  [address], [place] [phone]  [phone]   [facility] Child Abuse Hotline (480)[phone] or [phone] (After Hours)

## 2014-09-13 ENCOUNTER — Encounter (HOSPITAL_COMMUNITY): Payer: Self-pay | Admitting: Emergency Medicine

## 2014-09-13 ENCOUNTER — Emergency Department (HOSPITAL_COMMUNITY)
Admission: EM | Admit: 2014-09-13 | Discharge: 2014-09-14 | Disposition: A | Discharge status: Home / Self Care | Payer: Self-pay | Attending: Emergency Medicine | Admitting: Emergency Medicine

## 2014-09-13 ENCOUNTER — Emergency Department (HOSPITAL_COMMUNITY): Payer: Self-pay

## 2014-09-13 DIAGNOSIS — J069 Acute upper respiratory infection, unspecified: Secondary | ICD-10-CM | POA: Insufficient documentation

## 2014-09-13 DIAGNOSIS — Z7952 Long term (current) use of systemic steroids: Secondary | ICD-10-CM | POA: Insufficient documentation

## 2014-09-13 DIAGNOSIS — J45901 Unspecified asthma with (acute) exacerbation: Secondary | ICD-10-CM | POA: Insufficient documentation

## 2014-09-13 DIAGNOSIS — R000 Tachycardia, unspecified: Secondary | ICD-10-CM | POA: Insufficient documentation

## 2014-09-13 LAB — I-STAT BETA HCG BLOOD, ED (MC, WL, AP ONLY)

## 2014-09-13 LAB — CBC WITH DIFFERENTIAL/PLATELET
BASOS ABS: 0 10*3/uL (ref 0.0–0.1)
Basophils Relative: 0 % (ref 0–1)
EOS ABS: 0 10*3/uL (ref 0.0–0.7)
Eosinophils Relative: 0 % (ref 0–5)
HCT: 42.5 % (ref 36.0–46.0)
Hemoglobin: 14.5 g/dL (ref 12.0–15.0)
Lymphocytes Relative: 11 % — ABNORMAL LOW (ref 12–46)
Lymphs Abs: 2.1 10*3/uL (ref 0.7–4.0)
MCH: 27.4 pg (ref 26.0–34.0)
MCHC: 34.1 g/dL (ref 30.0–36.0)
MCV: 80.2 fL (ref 78.0–100.0)
Monocytes Absolute: 0.8 10*3/uL (ref 0.1–1.0)
Monocytes Relative: 4 % (ref 3–12)
NEUTROS PCT: 85 % — AB (ref 43–77)
Neutro Abs: 16.6 10*3/uL — ABNORMAL HIGH (ref 1.7–7.7)
PLATELETS: 304 10*3/uL (ref 150–400)
RBC: 5.3 MIL/uL — AB (ref 3.87–5.11)
RDW: 13.9 % (ref 11.5–15.5)
WBC: 19.4 10*3/uL — AB (ref 4.0–10.5)

## 2014-09-13 MED ORDER — SODIUM CHLORIDE 0.9 % IV BOLUS (SEPSIS)
1000.0000 mL | Freq: Once | INTRAVENOUS | Status: AC
  Administered 2014-09-13: 1000 mL via INTRAVENOUS

## 2014-09-13 MED ORDER — ONDANSETRON HCL 4 MG/2ML IJ SOLN
4.0000 mg | Freq: Once | INTRAMUSCULAR | Status: AC
  Administered 2014-09-13: 4 mg via INTRAVENOUS
  Filled 2014-09-13: qty 2

## 2014-09-13 NOTE — ED Notes (Signed)
Brought in by EMS from home with c/o shortness of breath.  Hx of Asthma.  Had asthma exacerbation 4 days ago---- went to Urgent Care and was discharged on 5-day course of Prednisone (4th day today).  Pt reports progressive flare-up again today.  Was given Albuterol 5 mg neb treatment and Solu-Medrol 125 mg IV en route to ED.

## 2014-09-13 NOTE — ED Provider Notes (Addendum)
CSN: [id number]   Arrival date & time 09/13/14 2220  History  This chart was scribed for Linwood Dibbles, MD by Bethel Born, ED Scribe. This patient was seen in room WHALB/WHALB and the patient's care was started at 11:22 PM.  Chief Complaint  Patient presents with  . Shortness of Breath    HPI The history is provided by the patient. No language interpreter was used.   Nicole White is a 24 y.o. female with PMHx of asthma who presents to the Emergency Department complaining of constant SOB with gradual onset earlier today. Her home inhaler provided insufficient relief PTA.  Associated symptoms include cough, congestion, an episode of emesis, and headache.She did not take a decongestant at home.  Pt denies fever and diarrhea. Her asthma has no identified triggers. No recent travel, tick bites, or known sick exposure. No personal history of DVT/PE. No oral contraceptive use. No known drug allergies. Pt denies tobacco use.    Past Medical History  Diagnosis Date  . Asthma     Past Surgical History  Procedure Laterality Date  . No past surgeries      Family History  Problem Relation Age of Onset  . Asthma Father   . Autism Brother     Social History  Substance Use Topics  . Smoking status: Never Smoker   . Smokeless tobacco: Never Used  . Alcohol Use: No     Review of Systems  Constitutional: Negative for fever.  HENT: Positive for congestion.   Respiratory: Positive for cough and shortness of breath.   Gastrointestinal: Positive for nausea and vomiting. Negative for diarrhea.    Home Medications   Prior to Admission medications   Medication Sig Start Date End Date Taking? Authorizing Provider  albuterol (PROVENTIL HFA;VENTOLIN HFA) 108 (90 BASE) MCG/ACT inhaler Inhale 1-2 puffs into the lungs every 6 (six) hours as needed for wheezing or shortness of breath. 04/16/14  Yes Purvis Sheffield, MD  predniSONE (DELTASONE) 20 MG tablet Take 20 mg by mouth daily with breakfast.   Yes  Historical Provider, MD  lidocaine (XYLOCAINE) 2 % solution Use as directed 20 mLs in the mouth or throat as needed for mouth pain. Patient not taking: Reported on 09/13/2014 06/29/14   Catha Gosselin, PA-C  predniSONE (DELTASONE) 20 MG tablet Take 3 tablets (60 mg total) by mouth daily. Patient not taking: Reported on 06/29/2014 06/01/14   Elpidio Anis, PA-C  pseudoephedrine (SUDAFED 12 HOUR) 120 MG 12 hr tablet Take 1 tablet (120 mg total) by mouth every 12 (twelve) hours. 09/14/14   Linwood Dibbles, MD    Allergies  Peanut-containing drug products; Shellfish allergy; and Coconut flavor  Triage Vitals: BP 123/75 mmHg  Pulse 103  Temp(Src) 98.1 F (36.7 C) (Oral)  Resp 20  SpO2 96%  Physical Exam  Constitutional: She appears well-developed and well-nourished. No distress.  HENT:  Head: Normocephalic and atraumatic.  Right Ear: External ear normal.  Left Ear: External ear normal.  Nasal congestion   Eyes: Conjunctivae are normal. Right eye exhibits no discharge. Left eye exhibits no discharge. No scleral icterus.  Neck: Neck supple. No tracheal deviation present.  Cardiovascular: Regular rhythm and intact distal pulses.  Tachycardia present.   Pulmonary/Chest: Effort normal and breath sounds normal. No stridor. No respiratory distress. She has no wheezes. She has no rales.  Abdominal: Soft. Bowel sounds are normal. She exhibits no distension. There is no tenderness. There is no rebound and no guarding.  Musculoskeletal: She exhibits no edema  or tenderness.  Neurological: She is alert. She has normal strength. No cranial nerve deficit (no facial droop, extraocular movements intact, no slurred speech) or sensory deficit. She exhibits normal muscle tone. She displays no seizure activity. Coordination normal.  Skin: Skin is warm and dry. No rash noted.  Psychiatric: She has a normal mood and affect.  Nursing note and vitals reviewed.   ED Course  Procedures   DIAGNOSTIC STUDIES: Oxygen  Saturation is 96% on RA, normal by my interpretation.    COORDINATION OF CARE: 11:25 PM Discussed treatment plan which includes CXR, lab work, Zofran, and IVF with pt at bedside and pt agreed to plan.  Labs Reviewed  CBC WITH DIFFERENTIAL/PLATELET - Abnormal; Notable for the following:    WBC 19.4 (*)    RBC 5.30 (*)    Neutrophils Relative % 85 (*)    Neutro Abs 16.6 (*)    Lymphocytes Relative 11 (*)    All other components within normal limits  BASIC METABOLIC PANEL - Abnormal; Notable for the following:    Glucose, Bld 122 (*)    All other components within normal limits  I-STAT BETA HCG BLOOD, ED (MC, [facility], AP ONLY)    I, Linwood Dibbles, MD, personally reviewed and evaluated these images and lab results as part of my medical decision-making.  Imaging Review Dg Chest 2 View  09/14/2014   CLINICAL DATA:  Cough, shortness of breath and chest pain for 5 days.  EXAM: CHEST  2 VIEW  COMPARISON:  12/19/2013 and prior radiographs  FINDINGS: The cardiomediastinal silhouette is unremarkable.  Mild peribronchial thickening is unchanged.  There is no evidence of focal airspace disease, pulmonary edema, suspicious pulmonary nodule/mass, pleural effusion, or pneumothorax. No acute bony abnormalities are identified.  IMPRESSION: No active cardiopulmonary disease.   Electronically Signed   By: Harmon Pier M.D.   On: 09/14/2014 00:34     MDM   Final diagnoses:  URI, acute   Pt describes uri sx.  Lungs are clear on exam.  No wheezing.  Doubt PE, cardiac etiology. Suspect URI.  Dc home, supportive care.   I personally performed the services described in this documentation, which was scribed in my presence.  The recorded information has been reviewed and is accurate.    Linwood Dibbles, MD 09/14/14 [phone]  Pt ran out of her inhaler.  She requested one to go home.  Linwood Dibbles, MD 09/14/14 [phone]

## 2014-09-13 NOTE — ED Notes (Signed)
Bed: WHALB Expected date:  Expected time:  Means of arrival:  Comments: EMS 

## 2014-09-14 LAB — BASIC METABOLIC PANEL
Anion gap: 9 (ref 5–15)
BUN: 10 mg/dL (ref 6–20)
CHLORIDE: 102 mmol/L (ref 101–111)
CO2: 26 mmol/L (ref 22–32)
CREATININE: 0.68 mg/dL (ref 0.44–1.00)
Calcium: 9.3 mg/dL (ref 8.9–10.3)
GFR calc non Af Amer: >60 mL/min (ref >60)
Glucose, Bld: 122 mg/dL — ABNORMAL HIGH (ref 65–99)
Potassium: 3.7 mmol/L (ref 3.5–5.1)
SODIUM: 137 mmol/L (ref 135–145)

## 2014-09-14 MED ORDER — PSEUDOEPHEDRINE HCL ER 120 MG PO TB12
120.0000 mg | ORAL_TABLET | Freq: Two times a day (BID) | ORAL | Status: DC
  Administered 2014-09-14: 120 mg via ORAL
  Filled 2014-09-14 (×3): qty 1

## 2014-09-14 MED ORDER — ALBUTEROL SULFATE HFA 108 (90 BASE) MCG/ACT IN AERS
1.0000 | INHALATION_SPRAY | RESPIRATORY_TRACT | Status: DC | PRN
  Filled 2014-09-14: qty 6.7

## 2014-09-14 MED ORDER — PSEUDOEPHEDRINE HCL ER 120 MG PO TB12: 120.0000 mg | ORAL_TABLET | Freq: Two times a day (BID) | ORAL | Status: DC

## 2014-09-14 NOTE — Discharge Instructions (Signed)
Upper Respiratory Infection, Adult An upper respiratory infection (URI) is also sometimes known as the common cold. The upper respiratory tract includes the nose, sinuses, throat, trachea, and bronchi. Bronchi are the airways leading to the lungs. Most people improve within 1 week, but symptoms can last up to 2 weeks. A residual cough may last even longer.  CAUSES Many different viruses can infect the tissues lining the upper respiratory tract. The tissues become irritated and inflamed and often become very moist. Mucus production is also common. A cold is contagious. You can easily spread the virus to others by oral contact. This includes kissing, sharing a glass, coughing, or sneezing. Touching your mouth or nose and then touching a surface, which is then touched by another person, can also spread the virus. SYMPTOMS  Symptoms typically develop 1 to 3 days after you come in contact with a cold virus. Symptoms vary from person to person. They may include:  Runny nose.  Sneezing.  Nasal congestion.  Sinus irritation.  Sore throat.  Loss of voice (laryngitis).  Cough.  Fatigue.  Muscle aches.  Loss of appetite.  Headache.  Low-grade fever. DIAGNOSIS  You might diagnose your own cold based on familiar symptoms, since most people get a cold 2 to 3 times a year. Your caregiver can confirm this based on your exam. Most importantly, your caregiver can check that your symptoms are not due to another disease such as strep throat, sinusitis, pneumonia, asthma, or epiglottitis. Blood tests, throat tests, and X-rays are not necessary to diagnose a common cold, but they may sometimes be helpful in excluding other more serious diseases. Your caregiver will decide if any further tests are required. RISKS AND COMPLICATIONS  You may be at risk for a more severe case of the common cold if you smoke cigarettes, have chronic heart disease (such as heart failure) or lung disease (such as asthma), or if  you have a weakened immune system. The very young and very old are also at risk for more serious infections. Bacterial sinusitis, middle ear infections, and bacterial pneumonia can complicate the common cold. The common cold can worsen asthma and chronic obstructive pulmonary disease (COPD). Sometimes, these complications can require emergency medical care and may be life-threatening. PREVENTION  The best way to protect against getting a cold is to practice good hygiene. Avoid oral or hand contact with people with cold symptoms. Wash your hands often if contact occurs. There is no clear evidence that vitamin C, vitamin E, echinacea, or exercise reduces the chance of developing a cold. However, it is always recommended to get plenty of rest and practice good nutrition. TREATMENT  Treatment is directed at relieving symptoms. There is no cure. Antibiotics are not effective, because the infection is caused by a virus, not by bacteria. Treatment may include:  Increased fluid intake. Sports drinks offer valuable electrolytes, sugars, and fluids.  Breathing heated mist or steam (vaporizer or shower).  Eating chicken soup or other clear broths, and maintaining good nutrition.  Getting plenty of rest.  Using gargles or lozenges for comfort.  Controlling fevers with ibuprofen or acetaminophen as directed by your caregiver.  Increasing usage of your inhaler if you have asthma. Zinc gel and zinc lozenges, taken in the first 24 hours of the common cold, can shorten the duration and lessen the severity of symptoms. Pain medicines may help with fever, muscle aches, and throat pain. A variety of non-prescription medicines are available to treat congestion and runny nose. Your caregiver   can make recommendations and may suggest nasal or lung inhalers for other symptoms.  HOME CARE INSTRUCTIONS   Only take over-the-counter or prescription medicines for pain, discomfort, or fever as directed by your  caregiver.  Use a warm mist humidifier or inhale steam from a shower to increase air moisture. This may keep secretions moist and make it easier to breathe.  Drink enough water and fluids to keep your urine clear or pale yellow.  Rest as needed.  Return to work when your temperature has returned to normal or as your caregiver advises. You may need to stay home longer to avoid infecting others. You can also use a face mask and careful hand washing to prevent spread of the virus. SEEK MEDICAL CARE IF:   After the first few days, you feel you are getting worse rather than better.  You need your caregiver's advice about medicines to control symptoms.  You develop chills, worsening shortness of breath, or brown or red sputum. These may be signs of pneumonia.  You develop yellow or brown nasal discharge or pain in the face, especially when you bend forward. These may be signs of sinusitis.  You develop a fever, swollen neck glands, pain with swallowing, or white areas in the back of your throat. These may be signs of strep throat. SEEK IMMEDIATE MEDICAL CARE IF:   You have a fever.  You develop severe or persistent headache, ear pain, sinus pain, or chest pain.  You develop wheezing, a prolonged cough, cough up blood, or have a change in your usual mucus (if you have chronic lung disease).  You develop sore muscles or a stiff neck. Document Released: 06/23/2000 Document Revised: 03/22/2011 Document Reviewed: 04/04/2013 [employer] Patient Information 2015 [employer]. This information is not intended to replace advice given to you by your health care provider. Make sure you discuss any questions you have with your health care provider.  

## 2015-01-07 ENCOUNTER — Emergency Department (HOSPITAL_COMMUNITY)
Admission: EM | Admit: 2015-01-07 | Discharge: 2015-01-07 | Disposition: A | Discharge status: Home / Self Care | Payer: Self-pay | Attending: Physician Assistant | Admitting: Physician Assistant

## 2015-01-07 DIAGNOSIS — Z7952 Long term (current) use of systemic steroids: Secondary | ICD-10-CM | POA: Insufficient documentation

## 2015-01-07 DIAGNOSIS — Z79899 Other long term (current) drug therapy: Secondary | ICD-10-CM | POA: Insufficient documentation

## 2015-01-07 DIAGNOSIS — J45909 Unspecified asthma, uncomplicated: Secondary | ICD-10-CM | POA: Insufficient documentation

## 2015-01-07 MED ORDER — ALBUTEROL SULFATE HFA 108 (90 BASE) MCG/ACT IN AERS
2.0000 | INHALATION_SPRAY | Freq: Once | RESPIRATORY_TRACT | Status: AC
  Administered 2015-01-07: 2 via RESPIRATORY_TRACT
  Filled 2015-01-07: qty 6.7

## 2015-01-07 NOTE — ED Provider Notes (Signed)
CSN: [id number]     Arrival date & time 01/07/15  1532 History  By signing my name below, I, [name], attest that this documentation has been prepared under the direction and in the presence of Ivery Quale, PA-C Electronically Signed: Charline Bills, ED Scribe 01/07/2015 at 5:48 PM.   Chief Complaint  Patient presents with  . Asthma   Patient is a 24 y.o. female presenting with asthma. The history is provided by the patient. No language interpreter was used.  Asthma This is a chronic (acute on chronic issue) problem. The current episode started 1 to 2 hours ago. The problem has been resolved. The symptoms are relieved by medications. The treatment provided significant relief.   HPI Comments: Nicole White is a 24 y.o. female, brought in by EMS, who presents to the Emergency Department complaining of intermittent chest tightness for a few days, worsened PTA. Pt states that she has been without her inhaler for a while but recently borrowed her friend's nebulizer. She reports that she recently got over an URI which she suspects triggered her asthma. Pt was given 2 duoneb treatments 10 mg albuterol, 500 mcg Atrovent in route. She denies fever. Pt denies tobacco use.   Past Medical History  Diagnosis Date  . Asthma    Past Surgical History  Procedure Laterality Date  . No past surgeries     Family History  Problem Relation Age of Onset  . Asthma Father   . Autism Brother    Social History  Substance Use Topics  . Smoking status: Never Smoker   . Smokeless tobacco: Never Used  . Alcohol Use: No   OB History    No data available     Review of Systems  Constitutional: Negative for fever.  Respiratory: Positive for chest tightness.   All other systems reviewed and are negative.  Allergies  Peanut-containing drug products; Shellfish allergy; and Coconut flavor  Home Medications   Prior to Admission medications   Medication Sig Start Date End Date Taking? Authorizing  Provider  albuterol (PROVENTIL HFA;VENTOLIN HFA) 108 (90 BASE) MCG/ACT inhaler Inhale 1-2 puffs into the lungs every 6 (six) hours as needed for wheezing or shortness of breath. 04/16/14   Purvis Sheffield, MD  lidocaine (XYLOCAINE) 2 % solution Use as directed 20 mLs in the mouth or throat as needed for mouth pain. Patient not taking: Reported on 09/13/2014 06/29/14   Catha Gosselin, PA-C  predniSONE (DELTASONE) 20 MG tablet Take 3 tablets (60 mg total) by mouth daily. Patient not taking: Reported on 06/29/2014 06/01/14   Elpidio Anis, PA-C  predniSONE (DELTASONE) 20 MG tablet Take 20 mg by mouth daily with breakfast.    Historical Provider, MD  pseudoephedrine (SUDAFED 12 HOUR) 120 MG 12 hr tablet Take 1 tablet (120 mg total) by mouth every 12 (twelve) hours. 09/14/14   Linwood Dibbles, MD   BP 120/77 mmHg  Pulse 96  Temp(Src) 97.9 F (36.6 C) (Oral)  Resp 18  Wt 165 lb (74.844 kg)  SpO2 100% Physical Exam  Constitutional: She is oriented to person, place, and time. She appears well-developed and well-nourished. No distress.  HENT:  Head: Normocephalic and atraumatic.  Increased swelling of the tonsils. Uvula is midline.  Eyes: Conjunctivae and EOM are normal.  Neck: Neck supple. No tracheal deviation present.  Few small palpable lymph nodes.   Cardiovascular: Normal rate.   Pulmonary/Chest: Effort normal. No respiratory distress.  symmetrical rise and fall of the chest. Lungs are clear.  Musculoskeletal: Normal range of motion.  Neurological: She is alert and oriented to person, place, and time.  Skin: Skin is warm and dry. No rash noted.  Psychiatric: She has a normal mood and affect. Her behavior is normal.  Nursing note and vitals reviewed.  ED Course  Procedures (including critical care time) DIAGNOSTIC STUDIES: Oxygen Saturation is 100% on RA, normal by my interpretation.    COORDINATION OF CARE: 5:44 PM-Discussed treatment plan which includes albuterol inhaler with pt at bedside  and pt agreed to plan.   Labs Review Labs Reviewed - No data to display  Imaging Review No results found. I have personally reviewed and evaluated these images and lab results as part of my medical decision-making.   EKG Interpretation None      MDM  Pt presented to ED with asthma attack. Received 2 duonebs by EMS. Pt now has symmetrical rise and fall of the chest and speaks in complete sentences. Vital signs are stable. Pulse ox is 100% on room air.  Pt given albuterol inhaler. Pt to return to the ED if any acute changes or problem.   Final diagnoses:  Asthma, unspecified asthma severity, uncomplicated    **I have reviewed nursing notes, vital signs, and all appropriate lab and imaging results for this patient.*  **I personally performed the services described in this documentation, which was scribed in my presence. The recorded information has been reviewed and is accurate.Ivery Quale*   Shwanda Soltis, PA-C 01/08/15 1216  Courteney Lyn Corlis LeakMackuen, MD 01/09/15 0001

## 2015-01-07 NOTE — Discharge Instructions (Signed)
Asthma, Adult Asthma is a condition of the lungs in which the airways tighten and narrow. Asthma can make it hard to breathe. Asthma cannot be cured, but medicine and lifestyle changes can help control it. Asthma may be started (triggered) by:  Animal skin flakes (dander).  Dust.  Cockroaches.  Pollen.  Mold.  Smoke.  Cleaning products.  Hair sprays or aerosol sprays.  Paint fumes or strong smells.  Cold air, weather changes, and winds.  Crying or laughing hard.  Stress.  Certain medicines or drugs.  Foods, such as dried fruit, potato chips, and sparkling grape juice.  Infections or conditions (colds, flu).  Exercise.  Certain medical conditions or diseases.  Exercise or tiring activities. HOME CARE   Take medicine as told by your doctor.  Use a peak flow meter as told by your doctor. A peak flow meter is a tool that measures how well the lungs are working.  Record and keep track of the peak flow meter's readings.  Understand and use the asthma action plan. An asthma action plan is a written plan for taking care of your asthma and treating your attacks.  To help prevent asthma attacks:  Do not smoke. Stay away from secondhand smoke.  Change your heating and air conditioning filter often.  Limit your use of fireplaces and wood stoves.  Get rid of pests (such as roaches and mice) and their droppings.  Throw away plants if you see mold on them.  Clean your floors. Dust regularly. Use cleaning products that do not smell.  Have someone vacuum when you are not home. Use a vacuum cleaner with a HEPA filter if possible.  Replace carpet with wood, tile, or vinyl flooring. Carpet can trap animal skin flakes and dust.  Use allergy-proof pillows, mattress covers, and box spring covers.  Wash bed sheets and blankets every week in hot water and dry them in a dryer.  Use blankets that are made of polyester or cotton.  Clean bathrooms and kitchens with bleach.  If possible, have someone repaint the walls in these rooms with mold-resistant paint. Keep out of the rooms that are being cleaned and painted.  Wash hands often. GET HELP IF:  You have make a whistling sound when breaking (wheeze), have shortness of breath, or have a cough even if taking medicine to prevent attacks.  The colored mucus you cough up (sputum) is thicker than usual.  The colored mucus you cough up changes from clear or white to yellow, green, gray, or bloody.  You have problems from the medicine you are taking such as:  A rash.  Itching.  Swelling.  Trouble breathing.  You need reliever medicines more than 2-3 times a week.  Your peak flow measurement is still at 50-79% of your personal best after following the action plan for 1 hour.  You have a fever. GET HELP RIGHT AWAY IF:   You seem to be worse and are not responding to medicine during an asthma attack.  You are short of breath even at rest.  You get short of breath when doing very little activity.  You have trouble eating, drinking, or talking.  You have chest pain.  You have a fast heartbeat.  Your lips or fingernails start to turn blue.  You are light-headed, dizzy, or faint.  Your peak flow is less than 50% of your personal best.   This information is not intended to replace advice given to you by your health care provider. Make sure   you discuss any questions you have with your health care provider.   Document Released: 06/16/2007 Document Revised: 09/18/2014 Document Reviewed: 07/27/2012 Elsevier Interactive Patient Education 2016 [employer]  

## 2015-01-07 NOTE — ED Notes (Signed)
Patient presents via EMS for asthma. History of asthma. Two duoneb treatments 10mg  albuterol, 500mcg atrovent in route.   Last VS: 123/82, 94hr, 100% on duoneb, 16resp.

## 2015-01-07 NOTE — Progress Notes (Signed)
EDCM spoke to patient at bedside. Patient confirms she [place] a [place]cp [place]  [place] provided [facility] with [facility], informed patient of services there.  EDCM also provided patient with list of pcps who accept self pay patients, list of discount pharmacies and websites needymeds.org and GoodRX.com for medication assistance, phone number to inquire about the orange card, phone number to inquire about Mediciad, phone number to inquire about the Affordable Care Act, financial resources in the community such as local churches, [employer], urban ministries, and dental assistance for uninsured patients.  Patient thankful for resources.  No further EDCM needs at this time.

## 2015-01-26 ENCOUNTER — Emergency Department (HOSPITAL_COMMUNITY)
Admission: EM | Admit: 2015-01-26 | Discharge: 2015-01-26 | Disposition: A | Discharge status: Home / Self Care | Payer: Self-pay | Attending: Emergency Medicine | Admitting: Emergency Medicine

## 2015-01-26 DIAGNOSIS — Z79899 Other long term (current) drug therapy: Secondary | ICD-10-CM | POA: Insufficient documentation

## 2015-01-26 DIAGNOSIS — Z7952 Long term (current) use of systemic steroids: Secondary | ICD-10-CM | POA: Insufficient documentation

## 2015-01-26 DIAGNOSIS — J4521 Mild intermittent asthma with (acute) exacerbation: Secondary | ICD-10-CM | POA: Insufficient documentation

## 2015-01-26 DIAGNOSIS — J029 Acute pharyngitis, unspecified: Secondary | ICD-10-CM | POA: Insufficient documentation

## 2015-01-26 DIAGNOSIS — J4520 Mild intermittent asthma, uncomplicated: Secondary | ICD-10-CM

## 2015-01-26 MED ORDER — ALBUTEROL SULFATE HFA 108 (90 BASE) MCG/ACT IN AERS
2.0000 | INHALATION_SPRAY | RESPIRATORY_TRACT | Status: DC | PRN
  Administered 2015-01-26: 2 via RESPIRATORY_TRACT
  Filled 2015-01-26: qty 6.7

## 2015-01-26 NOTE — ED Provider Notes (Signed)
CSN: [id number]     Arrival date & time 01/26/15  2235 History  By signing my name below, I, Nicole White, attest that this documentation has been prepared under the direction and in the presence of Nicole AnisShari Yina Riviere, PA-C. Electronically Signed: Octavia HeirArianna White, ED Scribe. 01/26/2015. 11:08 PM.    Chief Complaint  Patient presents with  . Shortness of Breath  . Sore Throat      The history is provided by the patient. No language interpreter was used.   HPI Comments: Nicole White is a 25 y.o. female who has a hx of asthma presents to the Emergency Department complaining of intermittent shortness of breath, currently resolved. Pt also notes she has a sore throat that has been bothering her for awhile. She states she was at work when she had a flare up with her asthma and did not have an inhaler with her. She states she uses a friends inhaler whenever it is needed. Pt denies fever, congestion, and rhinorrhea. No chest pain.   Past Medical History  Diagnosis Date  . Asthma    Past Surgical History  Procedure Laterality Date  . No past surgeries     Family History  Problem Relation Age of Onset  . Asthma Father   . Autism Brother    Social History  Substance Use Topics  . Smoking status: Never Smoker   . Smokeless tobacco: Never Used  . Alcohol Use: No   OB History    No data available     Review of Systems  Constitutional: Negative for fever.  HENT: Positive for sore throat. Negative for congestion, rhinorrhea and trouble swallowing.   Respiratory: Negative for cough.        See HPI.  Cardiovascular: Negative for chest pain.  Gastrointestinal: Negative for nausea.  Musculoskeletal: Negative for myalgias.  Neurological: Negative for headaches.  All other systems reviewed and are negative.     Allergies  Peanut-containing drug products; Shellfish allergy; and Coconut flavor  Home Medications   Prior to Admission medications   Medication Sig Start Date End Date  Taking? Authorizing Provider  albuterol (PROVENTIL HFA;VENTOLIN HFA) 108 (90 BASE) MCG/ACT inhaler Inhale 1-2 puffs into the lungs every 6 (six) hours as needed for wheezing or shortness of breath. 04/16/14   Purvis SheffieldForrest Harrison, MD  lidocaine (XYLOCAINE) 2 % solution Use as directed 20 mLs in the mouth or throat as needed for mouth pain. Patient not taking: Reported on 09/13/2014 06/29/14   Catha GosselinHanna Patel-Mills, PA-C  predniSONE (DELTASONE) 20 MG tablet Take 3 tablets (60 mg total) by mouth daily. Patient not taking: Reported on 06/29/2014 06/01/14   Nicole AnisShari Velina Drollinger, PA-C  predniSONE (DELTASONE) 20 MG tablet Take 20 mg by mouth daily with breakfast.    Historical Provider, MD  pseudoephedrine (SUDAFED 12 HOUR) 120 MG 12 hr tablet Take 1 tablet (120 mg total) by mouth every 12 (twelve) hours. 09/14/14   Linwood DibblesJon Knapp, MD    Triage vitals: BP 123/82 mmHg  Pulse 98  Temp(Src) 98.1 F (36.7 C) (Oral)  Resp 20  SpO2 99%  LMP 11/26/2014 (Approximate) Physical Exam  Constitutional: She is oriented to person, place, and time. She appears well-developed and well-nourished. No distress.  HENT:  Head: Normocephalic and atraumatic.  Mouth/Throat: Oropharynx is clear and moist.  Neck: Neck supple.  Cardiovascular: Normal rate.   No murmur heard. Pulmonary/Chest: Effort normal and breath sounds normal. She has no wheezes. She has no rales. She exhibits no tenderness.  Abdominal: Soft. There  is no tenderness.  Musculoskeletal: Normal range of motion.  Neurological: She is alert and oriented to person, place, and time.  Skin: She is not diaphoretic.  Nursing note and vitals reviewed.   ED Course  Procedures  DIAGNOSTIC STUDIES: Oxygen Saturation is 99% on RA, normal by my interpretation.  COORDINATION OF CARE:  11:05 PM Discussed treatment plan which includes inhaler with pt at bedside and pt agreed to plan.  Labs Review Labs Reviewed - No data to display  Imaging Review No results found. I have personally  reviewed and evaluated these images and lab results as part of my medical decision-making.   EKG Interpretation None      MDM   Final diagnoses:  None    1. Asthma, mild intermittent, uncomplicated  The patient is currently asymptomatic without intervention. VSS, no hypoxia. Will provide inhaler and resource list for PCP.   I personally performed the services described in this documentation, which was scribed in my presence. The recorded information has been reviewed and is accurate.    Nicole Anis, PA-C 01/27/15 1610  Linwood Dibbles, MD 01/28/15 1030

## 2015-01-26 NOTE — Discharge Instructions (Signed)
Asthma, Adult Asthma is a recurring condition in which the airways tighten and narrow. Asthma can make it difficult to breathe. It can cause coughing, wheezing, and shortness of breath. Asthma episodes, also called asthma attacks, range from minor to life-threatening. Asthma cannot be cured, but medicines and lifestyle changes can help control it. CAUSES Asthma is believed to be caused by inherited (genetic) and environmental factors, but its exact cause is unknown. Asthma may be triggered by allergens, lung infections, or irritants in the air. Asthma triggers are different for each person. Common triggers include:   Animal dander.  Dust mites.  Cockroaches.  Pollen from trees or grass.  Mold.  Smoke.  Air pollutants such as dust, household cleaners, hair sprays, aerosol sprays, paint fumes, strong chemicals, or strong odors.  Cold air, weather changes, and winds (which increase molds and pollens in the air).  Strong emotional expressions such as crying or laughing hard.  Stress.  Certain medicines (such as aspirin) or types of drugs (such as beta-blockers).  Sulfites in foods and drinks. Foods and drinks that may contain sulfites include dried fruit, potato chips, and sparkling grape juice.  Infections or inflammatory conditions such as the flu, a cold, or an inflammation of the nasal membranes (rhinitis).  Gastroesophageal reflux disease (GERD).  Exercise or strenuous activity. SYMPTOMS Symptoms may occur immediately after asthma is triggered or many hours later. Symptoms include:  Wheezing.  Excessive nighttime or early morning coughing.  Frequent or severe coughing with a common cold.  Chest tightness.  Shortness of breath. DIAGNOSIS  The diagnosis of asthma is made by a review of your medical history and a physical exam. Tests may also be performed. These may include:  Lung function studies. These tests show how much air you breathe in and out.  Allergy  tests.  Imaging tests such as X-rays. TREATMENT  Asthma cannot be cured, but it can usually be controlled. Treatment involves identifying and avoiding your asthma triggers. It also involves medicines. There are 2 classes of medicine used for asthma treatment:   Controller medicines. These prevent asthma symptoms from occurring. They are usually taken every day.  Reliever or rescue medicines. These quickly relieve asthma symptoms. They are used as needed and provide short-term relief. Your health care provider will help you create an asthma action plan. An asthma action plan is a written plan for managing and treating your asthma attacks. It includes a list of your asthma triggers and how they may be avoided. It also includes information on when medicines should be taken and when their dosage should be changed. An action plan may also involve the use of a device called a peak flow meter. A peak flow meter measures how well the lungs are working. It helps you monitor your condition. HOME CARE INSTRUCTIONS   Take medicines only as directed by your health care provider. Speak with your health care provider if you have questions about how or when to take the medicines.  Use a peak flow meter as directed by your health care provider. Record and keep track of readings.  Understand and use the action plan to help minimize or stop an asthma attack without needing to seek medical care.  Control your home environment in the following ways to help prevent asthma attacks:  Do not smoke. Avoid being exposed to secondhand smoke.  Change your heating and air conditioning filter regularly.  Limit your use of fireplaces and wood stoves.  Get rid of pests (such as roaches   and mice) and their droppings.  Throw away plants if you see mold on them.  Clean your floors and dust regularly. Use unscented cleaning products.  Try to have someone else vacuum for you regularly. Stay out of rooms while they are  being vacuumed and for a short while afterward. If you vacuum, use a dust mask from a hardware store, a double-layered or microfilter vacuum cleaner bag, or a vacuum cleaner with a HEPA filter.  Replace carpet with wood, tile, or vinyl flooring. Carpet can trap dander and dust.  Use allergy-proof pillows, mattress covers, and box spring covers.  Wash bed sheets and blankets every week in hot water and dry them in a dryer.  Use blankets that are made of polyester or cotton.  Clean bathrooms and kitchens with bleach. If possible, have someone repaint the walls in these rooms with mold-resistant paint. Keep out of the rooms that are being cleaned and painted.  Wash hands frequently. SEEK MEDICAL CARE IF:   You have wheezing, shortness of breath, or a cough even if taking medicine to prevent attacks.  The colored mucus you cough up (sputum) is thicker than usual.  Your sputum changes from clear or white to yellow, green, gray, or bloody.  You have any problems that may be related to the medicines you are taking (such as a rash, itching, swelling, or trouble breathing).  You are using a reliever medicine more than 2-3 times per week.  Your peak flow is still at 50-79% of your personal best after following your action plan for 1 hour.  You have a fever. SEEK IMMEDIATE MEDICAL CARE IF:   You seem to be getting worse and are unresponsive to treatment during an asthma attack.  You are short of breath even at rest.  You get short of breath when doing very little physical activity.  You have difficulty eating, drinking, or talking due to asthma symptoms.  You develop chest pain.  You develop a fast heartbeat.  You have a bluish color to your lips or fingernails.  You are light-headed, dizzy, or faint.  Your peak flow is less than 50% of your personal best.   This information is not intended to replace advice given to you by your health care provider. Make sure you discuss any  questions you have with your health care provider.   Document Released: 12/28/2004 Document Revised: 09/18/2014 Document Reviewed: 07/27/2012 Elsevier Interactive Patient Education 2016 Elsevier Inc.  

## 2015-01-26 NOTE — ED Notes (Signed)
Pt states that earlier she felt SOB but is not now. Also has had sore throat. Speaking in complete sentences. No respiratory problems. Alert and oriented.

## 2015-02-28 DIAGNOSIS — J069 Acute upper respiratory infection, unspecified: Secondary | ICD-10-CM | POA: Insufficient documentation

## 2015-02-28 DIAGNOSIS — J45901 Unspecified asthma with (acute) exacerbation: Secondary | ICD-10-CM | POA: Insufficient documentation

## 2015-02-28 DIAGNOSIS — Z7952 Long term (current) use of systemic steroids: Secondary | ICD-10-CM | POA: Insufficient documentation

## 2015-02-28 DIAGNOSIS — Z79899 Other long term (current) drug therapy: Secondary | ICD-10-CM | POA: Insufficient documentation

## 2015-03-01 ENCOUNTER — Emergency Department (HOSPITAL_COMMUNITY)
Admission: EM | Admit: 2015-03-01 | Discharge: 2015-03-01 | Disposition: A | Discharge status: Home / Self Care | Payer: Self-pay | Attending: Emergency Medicine | Admitting: Emergency Medicine

## 2015-03-01 ENCOUNTER — Encounter (HOSPITAL_COMMUNITY): Payer: Self-pay | Admitting: *Deleted

## 2015-03-01 DIAGNOSIS — J069 Acute upper respiratory infection, unspecified: Secondary | ICD-10-CM

## 2015-03-01 DIAGNOSIS — J45901 Unspecified asthma with (acute) exacerbation: Secondary | ICD-10-CM

## 2015-03-01 MED ORDER — IPRATROPIUM-ALBUTEROL 0.5-2.5 (3) MG/3ML IN SOLN
3.0000 mL | Freq: Once | RESPIRATORY_TRACT | Status: AC
  Administered 2015-03-01: 3 mL via RESPIRATORY_TRACT
  Filled 2015-03-01: qty 3

## 2015-03-01 MED ORDER — ALBUTEROL SULFATE HFA 108 (90 BASE) MCG/ACT IN AERS
1.0000 | INHALATION_SPRAY | Freq: Once | RESPIRATORY_TRACT | Status: AC
  Administered 2015-03-01: 2 via RESPIRATORY_TRACT
  Filled 2015-03-01: qty 6.7

## 2015-03-01 NOTE — ED Provider Notes (Signed)
CSN: [id number]     Arrival date & time 02/28/15  2340 History   First MD Initiated Contact with Patient 03/01/15 0011     Chief Complaint  Patient presents with  . Cough    HPI   25 year old female with a history of asthma presents today complaining of intermittent shortness of breath, congestion, rhinorrhea,  cough over the last 3 days. Patient reports that she's had a dry nonproductive cough that worsened earlier today. She reports attempting albuterol at home using an inhaler that did not provide significant relief. At the time of evaluation patient reports symptoms have improved, denies any current shortness of breath, chest pain. She denies any fever, chills, nausea, vomiting, abdominal pain, exposure to abnormal aggravating factor.   Past Medical History  Diagnosis Date  . Asthma    Past Surgical History  Procedure Laterality Date  . No past surgeries     Family History  Problem Relation Age of Onset  . Asthma Father   . Autism Brother    Social History  Substance Use Topics  . Smoking status: Never Smoker   . Smokeless tobacco: Never Used  . Alcohol Use: No   OB History    No data available     Review of Systems  All other systems reviewed and are negative.   Allergies  Peanut-containing drug products; Shellfish allergy; and Coconut flavor  Home Medications   Prior to Admission medications   Medication Sig Start Date End Date Taking? Authorizing Provider  albuterol (PROVENTIL HFA;VENTOLIN HFA) 108 (90 BASE) MCG/ACT inhaler Inhale 1-2 puffs into the lungs every 6 (six) hours as needed for wheezing or shortness of breath. 04/16/14   Purvis Sheffield, MD  predniSONE (DELTASONE) 20 MG tablet Take 20 mg by mouth daily with breakfast.    Historical Provider, MD  pseudoephedrine (SUDAFED 12 HOUR) 120 MG 12 hr tablet Take 1 tablet (120 mg total) by mouth every 12 (twelve) hours. Patient not taking: Reported on 01/26/2015 09/14/14   Linwood Dibbles, MD   BP 122/84 mmHg  Pulse  86  Temp(Src) 98.5 F (36.9 C) (Oral)  Resp 20  Ht  (1.575 m)  Wt 73.114 kg  BMI 29.47 kg/m2  SpO2 97%   Physical Exam  Constitutional: She is oriented to person, place, and time. She appears well-developed and well-nourished.  HENT:  Head: Normocephalic and atraumatic.  Eyes: Conjunctivae are normal. Pupils are equal, round, and reactive to light. Right eye exhibits no discharge. Left eye exhibits no discharge. No scleral icterus.  Neck: Normal range of motion. No JVD present. No tracheal deviation present.  Cardiovascular: Normal rate, regular rhythm, normal heart sounds and intact distal pulses.  Exam reveals no gallop and no friction rub.   No murmur heard. Pulmonary/Chest: Effort normal. No stridor. No respiratory distress. She has wheezes. She has no rales. She exhibits no tenderness.  Minor wheeze bilateral lower extremities, no crackles, rales  Musculoskeletal: Normal range of motion. She exhibits no edema or tenderness.  Neurological: She is alert and oriented to person, place, and time. Coordination normal.  Skin: Skin is warm and dry. No rash noted. No erythema. No pallor.  Psychiatric: She has a normal mood and affect. Her behavior is normal. Judgment and thought content normal.  Nursing note and vitals reviewed.   ED Course  Procedures (including critical care time) Labs Review Labs Reviewed - No data to display  Imaging Review No results found. I have personally reviewed and evaluated these images and  lab results as part of my medical decision-making.   EKG Interpretation None      MDM   Final diagnoses:  Asthma exacerbation  Viral URI    Labs:   Imaging:  Consults:  Therapeutics: DuoNeb  Discharge Meds: Albuterol inhaler  Assessment/Plan: 25 year old female presents today with complaints of cough and asthma exacerbation. She had very minimal wheeze, was given a breathing treatment here which resolved the wheezing, patient is afebrile,  nontoxic, with reassuring vital signs. I have very low suspicion for bacterial pulmonary infection, or significant asthma exacerbation. Patient will be given a albuterol inhaler here, encouraged follow-up with her primary care for reevaluation and management of her chronic asthma and frequent exacerbations. Patient verbalized understanding and agreement today's plan and had no further questions or concerns at time of discharge         Eyvonne Mechanic, PA-C 03/01/15 0042  Raeford Razor, MD 03/01/15 [phone]

## 2015-03-01 NOTE — ED Notes (Signed)
The pt came by ambulance with cold and cough for 3 days.  No known temp  Thick mucous when she coughs lmp irregular

## 2015-03-01 NOTE — ED Notes (Signed)
Patient is alert and orientedx4.  Patient was explained discharge instructions and they understood them with no questions.   

## 2015-03-01 NOTE — Discharge Instructions (Signed)
Asthma, Adult Asthma is a condition of the lungs in which the airways tighten and narrow. Asthma can make it hard to breathe. Asthma cannot be cured, but medicine and lifestyle changes can help control it. Asthma may be started (triggered) by:  Animal skin flakes (dander).  Dust.  Cockroaches.  Pollen.  Mold.  Smoke.  Cleaning products.  Hair sprays or aerosol sprays.  Paint fumes or strong smells.  Cold air, weather changes, and winds.  Crying or laughing hard.  Stress.  Certain medicines or drugs.  Foods, such as dried fruit, potato chips, and sparkling grape juice.  Infections or conditions (colds, flu).  Exercise.  Certain medical conditions or diseases.  Exercise or tiring activities. HOME CARE   Take medicine as told by your doctor.  Use a peak flow meter as told by your doctor. A peak flow meter is a tool that measures how well the lungs are working.  Record and keep track of the peak flow meter's readings.  Understand and use the asthma action plan. An asthma action plan is a written plan for taking care of your asthma and treating your attacks.  To help prevent asthma attacks:  Do not smoke. Stay away from secondhand smoke.  Change your heating and air conditioning filter often.  Limit your use of fireplaces and wood stoves.  Get rid of pests (such as roaches and mice) and their droppings.  Throw away plants if you see mold on them.  Clean your floors. Dust regularly. Use cleaning products that do not smell.  Have someone vacuum when you are not home. Use a vacuum cleaner with a HEPA filter if possible.  Replace carpet with wood, tile, or vinyl flooring. Carpet can trap animal skin flakes and dust.  Use allergy-proof pillows, mattress covers, and box spring covers.  Wash bed sheets and blankets every week in hot water and dry them in a dryer.  Use blankets that are made of polyester or cotton.  Clean bathrooms and kitchens with bleach.  If possible, have someone repaint the walls in these rooms with mold-resistant paint. Keep out of the rooms that are being cleaned and painted.  Wash hands often. GET HELP IF:  You have make a whistling sound when breaking (wheeze), have shortness of breath, or have a cough even if taking medicine to prevent attacks.  The colored mucus you cough up (sputum) is thicker than usual.  The colored mucus you cough up changes from clear or white to yellow, green, gray, or bloody.  You have problems from the medicine you are taking such as:  A rash.  Itching.  Swelling.  Trouble breathing.  You need reliever medicines more than 2-3 times a week.  Your peak flow measurement is still at 50-79% of your personal best after following the action plan for 1 hour.  You have a fever. GET HELP RIGHT AWAY IF:   You seem to be worse and are not responding to medicine during an asthma attack.  You are short of breath even at rest.  You get short of breath when doing very little activity.  You have trouble eating, drinking, or talking.  You have chest pain.  You have a fast heartbeat.  Your lips or fingernails start to turn blue.  You are light-headed, dizzy, or faint.  Your peak flow is less than 50% of your personal best.   This information is not intended to replace advice given to you by your health care provider. Make sure   you discuss any questions you have with your health care provider.   Document Released: 06/16/2007 Document Revised: 09/18/2014 Document Reviewed: 07/27/2012 Elsevier Interactive Patient Education 2016 [employer]  

## 2015-04-27 ENCOUNTER — Encounter (HOSPITAL_COMMUNITY): Payer: Self-pay | Admitting: *Deleted

## 2015-04-27 ENCOUNTER — Emergency Department (HOSPITAL_COMMUNITY)
Admission: EM | Admit: 2015-04-27 | Discharge: 2015-04-27 | Disposition: A | Discharge status: Home / Self Care | Payer: Self-pay | Attending: Emergency Medicine | Admitting: Emergency Medicine

## 2015-04-27 ENCOUNTER — Emergency Department (HOSPITAL_COMMUNITY): Payer: Self-pay

## 2015-04-27 DIAGNOSIS — J45901 Unspecified asthma with (acute) exacerbation: Secondary | ICD-10-CM | POA: Insufficient documentation

## 2015-04-27 DIAGNOSIS — Z79899 Other long term (current) drug therapy: Secondary | ICD-10-CM | POA: Insufficient documentation

## 2015-04-27 DIAGNOSIS — Z3202 Encounter for pregnancy test, result negative: Secondary | ICD-10-CM | POA: Insufficient documentation

## 2015-04-27 LAB — POC URINE PREG, ED: PREG TEST UR: NEGATIVE

## 2015-04-27 MED ORDER — ALBUTEROL SULFATE HFA 108 (90 BASE) MCG/ACT IN AERS
4.0000 | INHALATION_SPRAY | Freq: Once | RESPIRATORY_TRACT | Status: AC
  Administered 2015-04-27: 4 via RESPIRATORY_TRACT
  Filled 2015-04-27: qty 6.7

## 2015-04-27 MED ORDER — AEROCHAMBER PLUS FLO-VU MEDIUM MISC
1.0000 | Freq: Once | Status: AC
  Administered 2015-04-27: 1
  Filled 2015-04-27 (×2): qty 1

## 2015-04-27 MED ORDER — DEXAMETHASONE 4 MG PO TABS
10.0000 mg | ORAL_TABLET | Freq: Once | ORAL | Status: AC
  Administered 2015-04-27: 10 mg via ORAL
  Filled 2015-04-27: qty 2

## 2015-04-27 NOTE — Discharge Instructions (Signed)
Asthma, Acute Bronchospasm °Acute bronchospasm caused by asthma is also referred to as an asthma attack. Bronchospasm means your air passages become narrowed. The narrowing is caused by inflammation and tightening of the muscles in the air tubes (bronchi) in your lungs. This can make it hard to breathe or cause you to wheeze and cough. °CAUSES °Possible triggers are: °· Animal dander from the skin, hair, or feathers of animals. °· Dust mites contained in house dust. °· Cockroaches. °· Pollen from trees or grass. °· Mold. °· Cigarette or tobacco smoke. °· Air pollutants such as dust, household cleaners, hair sprays, aerosol sprays, paint fumes, strong chemicals, or strong odors. °· Cold air or weather changes. Cold air may trigger inflammation. Winds increase molds and pollens in the air. °· Strong emotions such as crying or laughing hard. °· Stress. °· Certain medicines such as aspirin or beta-blockers. °· Sulfites in foods and drinks, such as dried fruits and wine. °· Infections or inflammatory conditions, such as a flu, cold, or inflammation of the nasal membranes (rhinitis). °· Gastroesophageal reflux disease (GERD). GERD is a condition where stomach acid backs up into your esophagus. °· Exercise or strenuous activity. °SIGNS AND SYMPTOMS  °· Wheezing. °· Excessive coughing, particularly at night. °· Chest tightness. °· Shortness of breath. °DIAGNOSIS  °Your health care provider will ask you about your medical history and perform a physical exam. A chest X-ray or blood testing may be performed to look for other causes of your symptoms or other conditions that may have triggered your asthma attack.  °TREATMENT  °Treatment is aimed at reducing inflammation and opening up the airways in your lungs.  Most asthma attacks are treated with inhaled medicines. These include quick relief or rescue medicines (such as bronchodilators) and controller medicines (such as inhaled corticosteroids). These medicines are sometimes  given through an inhaler or a nebulizer. Systemic steroid medicine taken by mouth or given through an IV tube also can be used to reduce the inflammation when an attack is moderate or severe. Antibiotic medicines are only used if a bacterial infection is present.  °HOME CARE INSTRUCTIONS  °· Rest. °· Drink plenty of liquids. This helps the mucus to remain thin and be easily coughed up. Only use caffeine in moderation and do not use alcohol until you have recovered from your illness. °· Do not smoke. Avoid being exposed to secondhand smoke. °· You play a critical role in keeping yourself in good health. Avoid exposure to things that cause you to wheeze or to have breathing problems. °· Keep your medicines up-to-date and available. Carefully follow your health care provider's treatment plan. °· Take your medicine exactly as prescribed. °· When pollen or pollution is bad, keep windows closed and use an air conditioner or go to places with air conditioning. °· Asthma requires careful medical care. See your health care provider for a follow-up as advised. If you are more than [redacted] weeks pregnant and you were prescribed any new medicines, let your obstetrician know about the visit and how you are doing. Follow up with your health care provider as directed. °· After you have recovered from your asthma attack, make an appointment with your outpatient doctor to talk about ways to reduce the likelihood of future attacks. If you do not have a doctor who manages your asthma, make an appointment with a primary care doctor to discuss your asthma. °SEEK IMMEDIATE MEDICAL CARE IF:  °· You are getting worse. °· You have trouble breathing. If severe, call your local   emergency services (911 in the [place]  You develop chest pain or discomfort.  You are vomiting.  You are not able to keep fluids down.  You are coughing up yellow, green, brown, or bloody sputum.  You have a fever and your symptoms suddenly get worse.  You have  trouble swallowing. MAKE SURE YOU:   Understand these instructions.  Will watch your condition.  Will get help right away if you are not doing well or get worse.   This information is not intended to replace advice given to you by your health care provider. Make sure you discuss any questions you have with your health care provider.   Document Released: 04/14/2006 Document Revised: 01/02/2013 Document Reviewed: 07/05/2012 Elsevier Interactive Patient Education 2016 [employer].    [employer] Assistance [employer] 211 is a great source of information about community services available.  Access by dialing 2-1-1 from anywhere in [place], or by website -  PooledIncome.pl.   Other Local Resources (Updated 01/2015)  Financial Assistance   Services    Phone Number and Address  [facility]  Low-cost medical care - 1st and 3rd Saturday of every month  Must not qualify for public or private insurance and must have limited income [phone] [address] [place], [place]    [place] [employer] of Social Services  Child care  Emergency assistance for housing and [employer]  Medicaid [phone] [address] [place], [place] [address]   [facility]  Low-cost medical care for children, communicable diseases, sexually-transmitted diseases, immunizations, maternity care, womens health and family planning [phone] [place], [place] [address]  [facility]   Medication assistance for [facility] residents  Must meet income requirements [phone] [address] [place], [place].    [facility] Social Services  Child care  Emergency assistance for housing and [employer]  Medicaid [id number] [address] [place], [place] [address]  [facility]   Low-cost medical care,   Monday through Friday, 9 am to 6 pm.   Accepts Medicare/Medicaid, and self-pay [phone] [address] [place], [place] [address]  [facility]  Low-cost medical care - Monday through Friday, 8:30 am - 5:30 pm  Accepts Medicaid and self-pay [phone] [address] [place], [place] [address]   [facility]  Primary medical care, including for those with sickle cell disease  Accepts Medicare, Medicaid, insurance and self-pay [phone] [address] [place], [place]  [facility]   Primary medical care  Accepts Medicare, [place], insurance and self-pay [phone] [address] [place], [place] [address]   [facility] Department of Social Services  Child care  Emergency assistance for housing and [employer]  Medicaid [phone] [address] [place], [place] [address]  [facility] Department of Health and [employer]  Child care  Emergency assistance for housing and [employer]  Medicaid [phone] [address] [place], [place] [address]   [facility] Medication Assistance Program  Medication assistance for [facility] residents with no insurance only  Must have a primary care doctor [phone] [address] [place], [place]  [facility]   Primary medical care  [place], [place], insurance  [phone] [address] [place], [place]  [facility]  Medication assistance [phone]  [facility]   Primary medical care  Accepts Medicare, Medicaid, insurance and self-pay [phone] [address] [place], [place] [address]  [facility]   Primary medical care  Accepts Medicare, [place], insurance and self-pay [phone] [address] [place], [place] [address]  [facility]   For [place] residents between the ages of 61 and 36 who do not have any form of health insurance, Medicare, [place], or [place] benefits.  Services are provided free of charge to uninsured patients who fall within federal poverty guidelines.    Hours: Tuesdays and Thursdays, 4:15 - 8 pm [phone] [address] [place], [place] [address]  [facility]     Primary medical care  Dental care  Nutritional counseling  Pharmacy  Accepts Medicaid, Medicare, most insurance.  Fees are adjusted based on ability to pay.   [phone] [facility] [address] [place], [place]  [phone] [facility] [address] [place], [place]  [phone] [facility] [place], [place]  [phone] [facility], [address] [place], [place]  [phone] [facility] [address] [place], [place]  [facility]  Womens health and family planning [phone] [address] [place], [place]  [facility] Department of Social Services  Child care  Emergency assistance for housing and [employer]  Medicaid [phone] [address] [place], [place] [address]   [facility]    Ages 46 and older  Hours: Mondays and Thursdays, 7:00 am - 9:00 am Patients are seen on a first come, first served basis. [phone], ext. [phone] [place], [place]  [facility] Division of Social Services  Child care  Emergency assistance for housing and [employer]  Medicaid [phone] [address] [place], [place] [address]  [facility]  Medication assistance  Rental assistance  Food pantry  Medication assistance  Housing assistance  Emergency food distribution  Utility assistance [phone] [address] [place], [place]  [phone]  [address] [place], [place] [address] Hours: Tuesdays and Thursdays from 9am - 12 noon by appointment only  [phone] [address] [place], [place] [address]  [facility] - Lanae Boast   Accepts private insurance, [place], and [place].  Payment is based on a sliding scale for those without insurance.  Hours: Mondays, Tuesdays and Thursdays, 8:30 am - 5:30 pm.   [phone] [address] [place], [place]  [facility] - Family Medicine at [facility], [place], and [place].  Payment is based on a sliding scale for those without insurance. [phone] [address] [place], [place]  [facility] - Pediatrics at [address] Scientist, research (physical sciences), [employer], and [place].  Payment is based on a sliding scale for those without insurance [phone] [address], [employer], [place]  [facility] - Pediatrics at [employer], [place], and [place].  Payment is based on a sliding scale for those without insurance. [phone] [address] [place], [place]  [facility] - Pediatrics at [facility], [place], and [place].  Payment is based on a sliding scale for those without insurance. [phone], ext. [phone] [place], [place].    [facility]  Maternity care.  Accepts Medicaid and self-pay. [phone] [address] [place], [place]

## 2015-04-27 NOTE — ED Provider Notes (Signed)
CSN: [id number]     Arrival date & time 04/27/15  1328 History   First MD Initiated Contact with Patient 04/27/15 1539     Chief Complaint  Patient presents with  . Shortness of Breath     (Consider location/radiation/quality/duration/timing/severity/associated sxs/prior Treatment) HPI 25 year old female who presents with 1 day of shortness of breath. She has a history of asthma, which is usually triggered by respiratory infection or allergies. States that with the seasonal changes recently she has been using her albuterol inhaler more frequently. Yesterday evening began to feel short of breath and gave herself her inhaler but ran out. Throughout the day today she has had increasing shortness of breath with chest tightness. No fever, chills, cough, congestion, sore throat or runny nose. No leg swelling or leg pain. EMS was called and she received an albuterol treatment prior to arrival which significantly improved her symptoms. Past Medical History  Diagnosis Date  . Asthma    Past Surgical History  Procedure Laterality Date  . No past surgeries     Family History  Problem Relation Age of Onset  . Asthma Father   . Autism Brother    Social History  Substance Use Topics  . Smoking status: Never Smoker   . Smokeless tobacco: Never Used  . Alcohol Use: No   OB History    No data available     Review of Systems 10/14 systems reviewed and are negative other than those stated in the HPI    Allergies  Coconut flavor; Peanut-containing drug products; and Shellfish allergy  Home Medications   Prior to Admission medications   Medication Sig Start Date End Date Taking? Authorizing Provider  albuterol (PROVENTIL HFA;VENTOLIN HFA) 108 (90 BASE) MCG/ACT inhaler Inhale 1-2 puffs into the lungs every 6 (six) hours as needed for wheezing or shortness of breath. 04/16/14  Yes Purvis Sheffield, MD   BP 117/79 mmHg  Pulse 65  Temp(Src) 97.8 F (36.6 C) (Oral)  Resp 17  SpO2 99%  LMP  04/13/2015 Physical Exam Physical Exam  Nursing note and vitals reviewed. Constitutional: Well developed, well nourished, non-toxic, and in no acute distress Head: Normocephalic and atraumatic.  Mouth/Throat: Oropharynx is clear and moist.  Neck: Normal range of motion. Neck supple.  Cardiovascular: Normal rate and regular rhythm.   Pulmonary/Chest: Effort normal, no conversational dyspnea, occasional wheeze.  Abdominal: Soft. There is no tenderness. There is no rebound and no guarding.  Musculoskeletal: Normal range of motion.  Neurological: Alert, no facial droop, fluent speech, moves all extremities symmetrically Skin: Skin is warm and dry.  Psychiatric: Cooperative  ED Course  Procedures (including critical care time) Labs Review Labs Reviewed  PREGNANCY, URINE  POC URINE PREG, ED    Imaging Review Dg Chest 2 View  04/27/2015  CLINICAL DATA:  Cough with shortness of breath since last night. mid chest pain when coughing. History of bronchitis and pneumonia. EXAM: CHEST  2 VIEW COMPARISON:  09/13/2014. FINDINGS: The heart size and mediastinal contours are normal. The lungs are clear. There is no pleural effusion or pneumothorax. No acute osseous findings are identified. IMPRESSION: Stable chest.  No active cardiopulmonary process. Electronically Signed   By: Carey Bullocks M.D.   On: 04/27/2015 16:47   I have personally reviewed and evaluated these images and lab results as part of my medical decision-making.    MDM   Final diagnoses:  Asthma exacerbation   25 year old female who presents with shortness of breath. Presentation consistent with that of  an asthma exacerbation. On my evaluation, she is breathing comfortably, on room air, with normal oxygenation. Occasional wheeze noted on lung exam. Given a dose of Decadron and breathing treatment through an inhaler. I feels that she is back at her baseline and she is appropriate for outpatient management. Chest x-ray with no  acute cardiopulmonary processes. Strict return and follow-up instructions are reviewed. She expressed understanding of all discharge instructions and felt comfortable with the plan of care.     Lavera Guiseana Duo Liu, MD 04/27/15 [phone]

## 2015-04-27 NOTE — ED Notes (Signed)
Pt transported by EMS, reports SOB since last night.  Has hx of asthma.  Received one round of neb tx en route.

## 2015-05-02 IMAGING — CR DG CHEST 2V
2 series · 2 of 2 positions shown · non-contrast
Comparison: 09/24/2012 and earlier.

CLINICAL DATA: 22-year-old female with chest pain, shortness of
breath. Initial encounter.

EXAM:
CHEST  2 VIEW

[w chest pa]
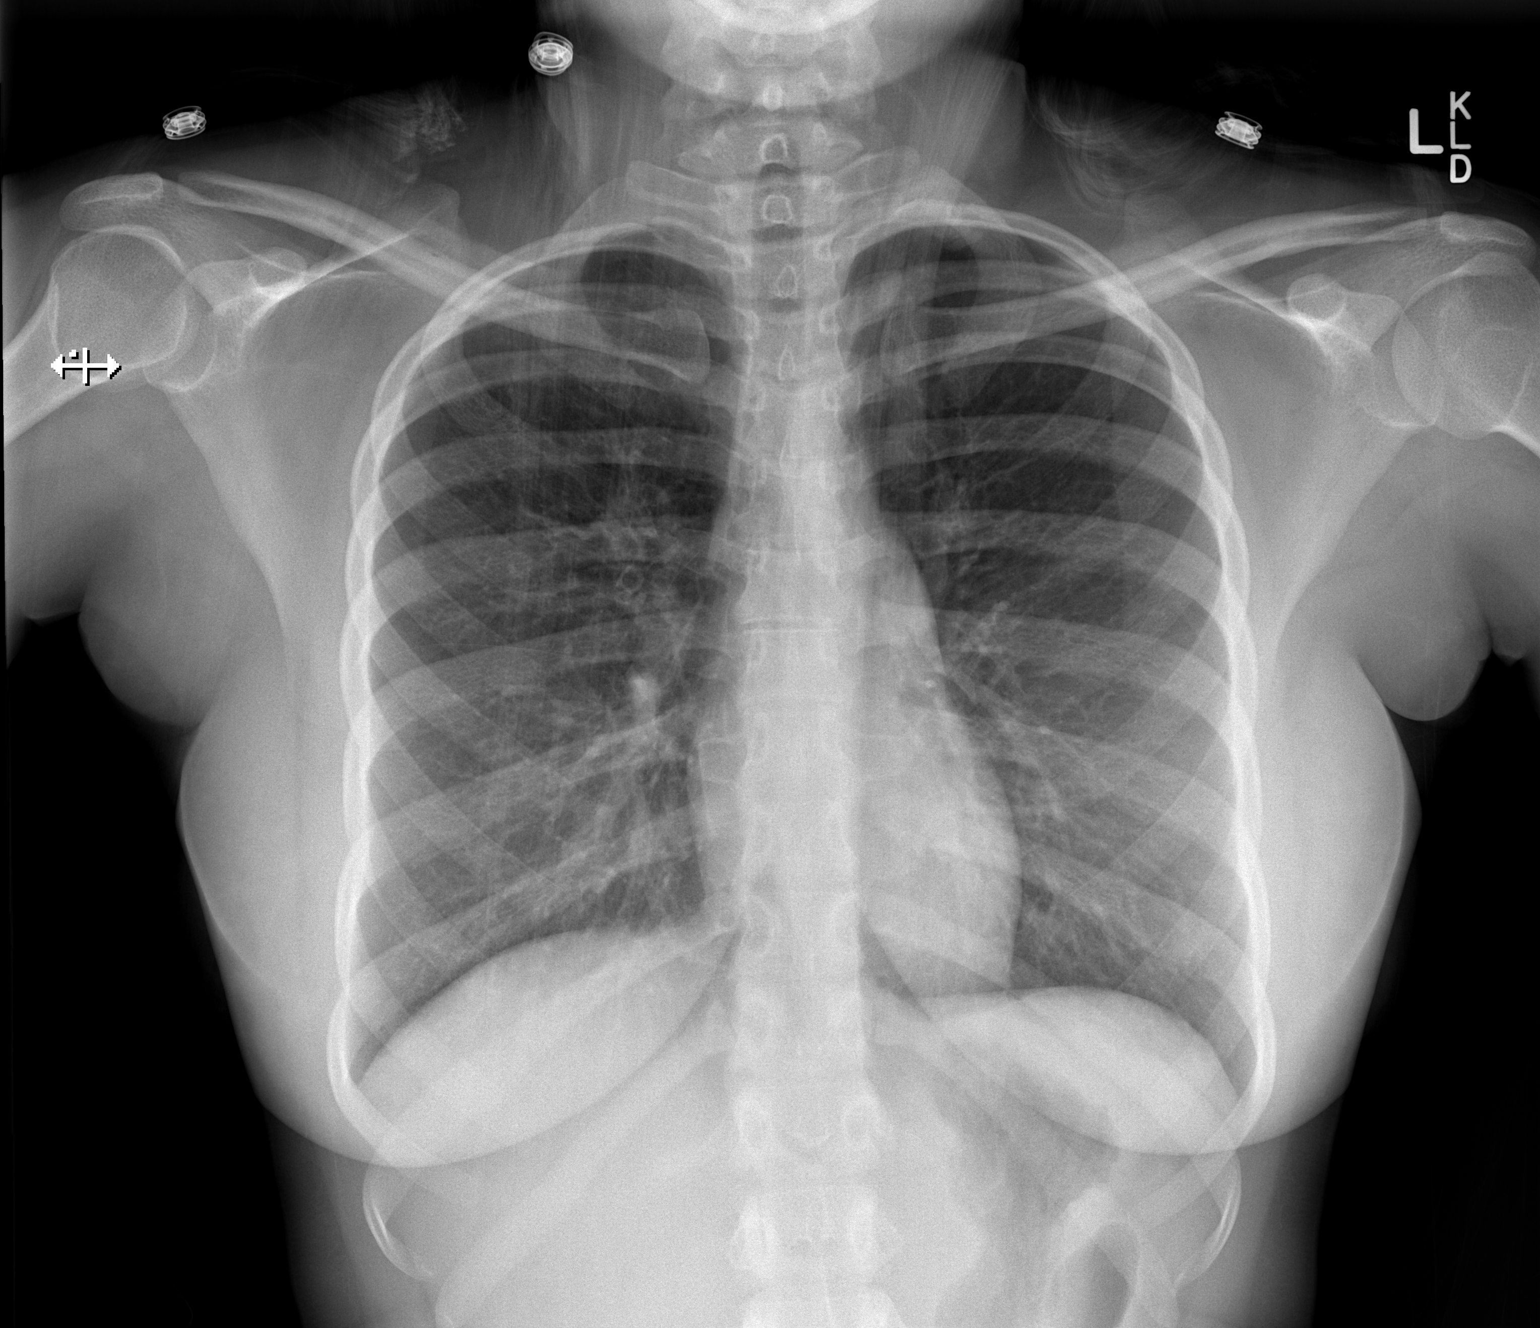

[w chest lat]
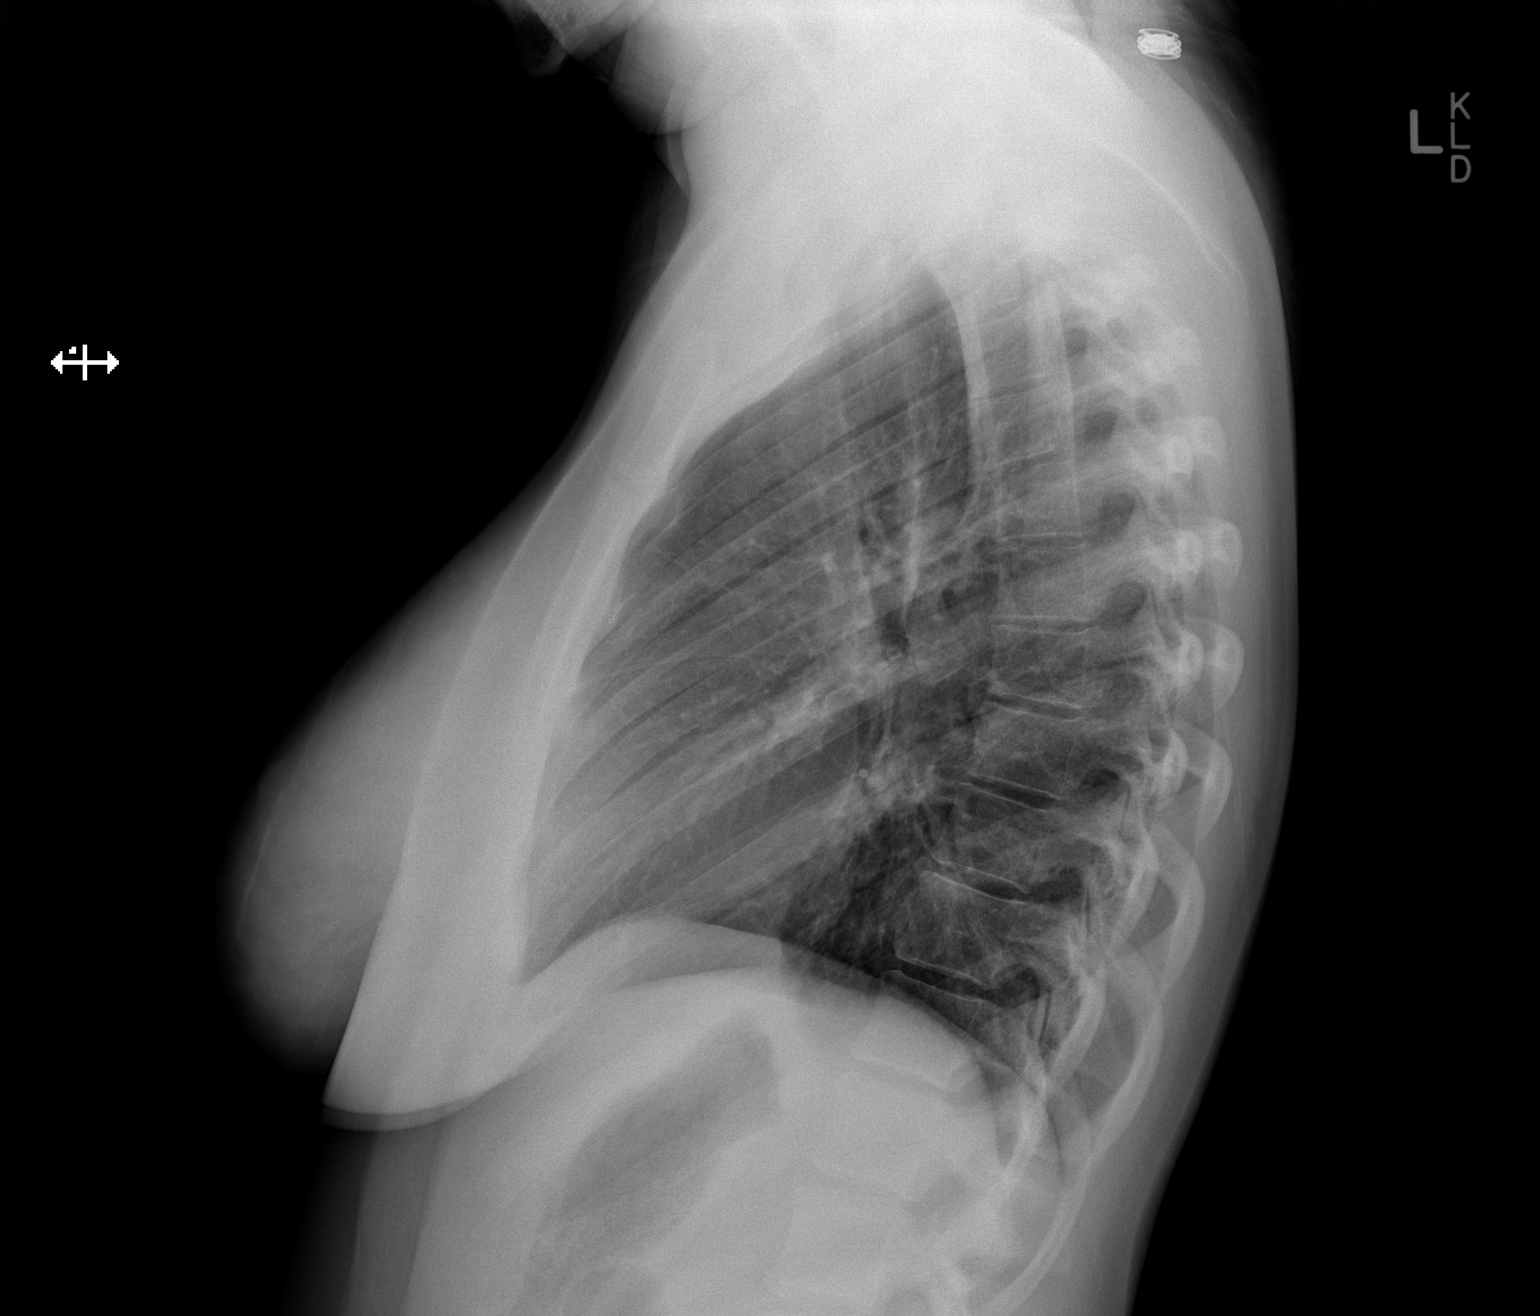

[2 of 2 positions shown; findings below may reference images not displayed]

FINDINGS: Hair artifact. Lung volumes are stable and within normal limits.
Normal cardiac size and mediastinal contours. Visualized tracheal
air column is within normal limits. The lungs are clear. No
pneumothorax or effusion. No osseous abnormality identified.
IMPRESSION: Negative, no acute cardiopulmonary abnormality.

## 2015-05-03 ENCOUNTER — Emergency Department (HOSPITAL_COMMUNITY): Payer: Self-pay

## 2015-05-03 ENCOUNTER — Encounter (HOSPITAL_COMMUNITY): Payer: Self-pay | Admitting: Emergency Medicine

## 2015-05-03 ENCOUNTER — Emergency Department (HOSPITAL_COMMUNITY)
Admission: EM | Admit: 2015-05-03 | Discharge: 2015-05-03 | Disposition: A | Discharge status: Home / Self Care | Payer: Self-pay | Attending: Emergency Medicine | Admitting: Emergency Medicine

## 2015-05-03 DIAGNOSIS — R110 Nausea: Secondary | ICD-10-CM | POA: Insufficient documentation

## 2015-05-03 DIAGNOSIS — Z79899 Other long term (current) drug therapy: Secondary | ICD-10-CM | POA: Insufficient documentation

## 2015-05-03 DIAGNOSIS — J45901 Unspecified asthma with (acute) exacerbation: Secondary | ICD-10-CM | POA: Insufficient documentation

## 2015-05-03 MED ORDER — ALBUTEROL SULFATE HFA 108 (90 BASE) MCG/ACT IN AERS
2.0000 | INHALATION_SPRAY | RESPIRATORY_TRACT | Status: DC | PRN
  Administered 2015-05-03: 2 via RESPIRATORY_TRACT
  Filled 2015-05-03: qty 6.7

## 2015-05-03 MED ORDER — DEXAMETHASONE 4 MG PO TABS
12.0000 mg | ORAL_TABLET | Freq: Once | ORAL | Status: AC
  Administered 2015-05-03: 12 mg via ORAL
  Filled 2015-05-03: qty 3

## 2015-05-03 MED ORDER — ALBUTEROL SULFATE (2.5 MG/3ML) 0.083% IN NEBU
5.0000 mg | INHALATION_SOLUTION | Freq: Once | RESPIRATORY_TRACT | Status: AC
  Administered 2015-05-03: 5 mg via RESPIRATORY_TRACT
  Filled 2015-05-03: qty 6

## 2015-05-03 NOTE — ED Provider Notes (Signed)
CSN: [id number]     Arrival date & time 05/03/15  4098 History   First MD Initiated Contact with Patient 05/03/15 0550     Chief Complaint  Patient presents with  . Shortness of Breath  . Wheezing  . Nausea     (Consider location/radiation/quality/duration/timing/severity/associated sxs/prior Treatment) Patient is a 25 y.o. female presenting with shortness of breath and wheezing. The history is provided by the patient.  Shortness of Breath Associated symptoms: wheezing   Wheezing Associated symptoms: shortness of breath   Patient with a history of asthma states that she started having difficulty breathing about 5 hours ago. There is a cough which is nonproductive. She denies fever, chills, sweats. She denies chest pain. She had an inhaler, but lost it. She feels that her wheezing is some that normally would respond to her inhaler at home.  Past Medical History  Diagnosis Date  . Asthma    Past Surgical History  Procedure Laterality Date  . No past surgeries     Family History  Problem Relation Age of Onset  . Asthma Father   . Autism Brother    Social History  Substance Use Topics  . Smoking status: Never Smoker   . Smokeless tobacco: Never Used  . Alcohol Use: No   OB History    No data available     Review of Systems  Respiratory: Positive for shortness of breath and wheezing.   All other systems reviewed and are negative.     Allergies  Coconut flavor; Peanut-containing drug products; and Shellfish allergy  Home Medications   Prior to Admission medications   Medication Sig Start Date End Date Taking? Authorizing Provider  albuterol (PROVENTIL HFA;VENTOLIN HFA) 108 (90 BASE) MCG/ACT inhaler Inhale 1-2 puffs into the lungs every 6 (six) hours as needed for wheezing or shortness of breath. 04/16/14  Yes Purvis Sheffield, MD  naproxen sodium (ANAPROX) 220 MG tablet Take 220 mg by mouth every 12 (twelve) hours as needed (for pain).   Yes Historical Provider, MD    BP 117/77 mmHg  Temp(Src) 98 F (36.7 C) (Oral)  Resp 16  Ht  (1.575 m)  Wt 165 lb (74.844 kg)  BMI 30.17 kg/m2  SpO2 96%  LMP 04/13/2015 Physical Exam  Nursing note and vitals reviewed.  25 year old female, resting comfortably and in no acute distress. Vital signs are normal. Oxygen saturation is 96%, which is normal. Head is normocephalic and atraumatic. PERRLA, EOMI. Oropharynx is clear. Neck is nontender and supple without adenopathy or JVD. Back is nontender and there is no CVA tenderness. Lungs have diffuse inspiratory neck spray wheezes without rales or rhonchi. Chest is nontender. Heart has regular rate and rhythm without murmur. Abdomen is soft, flat, nontender without masses or hepatosplenomegaly and peristalsis is normoactive. Extremities have no cyanosis or edema, full range of motion is present. Skin is warm and dry without rash. Neurologic: Mental status is normal, cranial nerves are intact, there are no motor or sensory deficits.  ED Course  Procedures (including critical care time)   MDM   Final diagnoses:  Asthma exacerbation    Exacerbation of asthma. Old records are reviewed and she has multiple ED visits for asthma, as well as occasional hospital admission. She is not in any distress currently. She is given albuterol with ipratropium and will be given a dose of dexamethasone.  Following above-noted treatment, patient stated that she is back to normal. On reexam, lungs are completely clear. Patient does not  have insurance and does not have a primary care provider. Resource guide is given and she is advised [facility] to get established there.  Dione Boozeavid Niels Cranshaw, MD 05/03/15 [phone]

## 2015-05-03 NOTE — Discharge Instructions (Signed)
Asthma, Adult Asthma is a recurring condition in which the airways tighten and narrow. Asthma can make it difficult to breathe. It can cause coughing, wheezing, and shortness of breath. Asthma episodes, also called asthma attacks, range from minor to life-threatening. Asthma cannot be cured, but medicines and lifestyle changes can help control it. CAUSES Asthma is believed to be caused by inherited (genetic) and environmental factors, but its exact cause is unknown. Asthma may be triggered by allergens, lung infections, or irritants in the air. Asthma triggers are different for each person. Common triggers include:   Animal dander.  Dust mites.  Cockroaches.  Pollen from trees or grass.  Mold.  Smoke.  Air pollutants such as dust, household cleaners, hair sprays, aerosol sprays, paint fumes, strong chemicals, or strong odors.  Cold air, weather changes, and winds (which increase molds and pollens in the air).  Strong emotional expressions such as crying or laughing hard.  Stress.  Certain medicines (such as aspirin) or types of drugs (such as beta-blockers).  Sulfites in foods and drinks. Foods and drinks that may contain sulfites include dried fruit, potato chips, and sparkling grape juice.  Infections or inflammatory conditions such as the flu, a cold, or an inflammation of the nasal membranes (rhinitis).  Gastroesophageal reflux disease (GERD).  Exercise or strenuous activity. SYMPTOMS Symptoms may occur immediately after asthma is triggered or many hours later. Symptoms include:  Wheezing.  Excessive nighttime or early morning coughing.  Frequent or severe coughing with a common cold.  Chest tightness.  Shortness of breath. DIAGNOSIS  The diagnosis of asthma is made by a review of your medical history and a physical exam. Tests may also be performed. These may include:  Lung function studies. These tests show how much air you breathe in and out.  Allergy  tests.  Imaging tests such as X-rays. TREATMENT  Asthma cannot be cured, but it can usually be controlled. Treatment involves identifying and avoiding your asthma triggers. It also involves medicines. There are 2 classes of medicine used for asthma treatment:   Controller medicines. These prevent asthma symptoms from occurring. They are usually taken every day.  Reliever or rescue medicines. These quickly relieve asthma symptoms. They are used as needed and provide short-term relief. Your health care provider will help you create an asthma action plan. An asthma action plan is a written plan for managing and treating your asthma attacks. It includes a list of your asthma triggers and how they may be avoided. It also includes information on when medicines should be taken and when their dosage should be changed. An action plan may also involve the use of a device called a peak flow meter. A peak flow meter measures how well the lungs are working. It helps you monitor your condition. HOME CARE INSTRUCTIONS   Take medicines only as directed by your health care provider. Speak with your health care provider if you have questions about how or when to take the medicines.  Use a peak flow meter as directed by your health care provider. Record and keep track of readings.  Understand and use the action plan to help minimize or stop an asthma attack without needing to seek medical care.  Control your home environment in the following ways to help prevent asthma attacks:  Do not smoke. Avoid being exposed to secondhand smoke.  Change your heating and air conditioning filter regularly.  Limit your use of fireplaces and wood stoves.  Get rid of pests (such as roaches  and mice) and their droppings.  Throw away plants if you see mold on them.  Clean your floors and dust regularly. Use unscented cleaning products.  Try to have someone else vacuum for you regularly. Stay out of rooms while they are  being vacuumed and for a short while afterward. If you vacuum, use a dust mask from a hardware store, a double-layered or microfilter vacuum cleaner bag, or a vacuum cleaner with a HEPA filter.  Replace carpet with wood, tile, or vinyl flooring. Carpet can trap dander and dust.  Use allergy-proof pillows, mattress covers, and box spring covers.  Wash bed sheets and blankets every week in hot water and dry them in a dryer.  Use blankets that are made of polyester or cotton.  Clean bathrooms and kitchens with bleach. If possible, have someone repaint the walls in these rooms with mold-resistant paint. Keep out of the rooms that are being cleaned and painted.  Wash hands frequently. SEEK MEDICAL CARE IF:   You have wheezing, shortness of breath, or a cough even if taking medicine to prevent attacks.  The colored mucus you cough up (sputum) is thicker than usual.  Your sputum changes from clear or white to yellow, green, gray, or bloody.  You have any problems that may be related to the medicines you are taking (such as a rash, itching, swelling, or trouble breathing).  You are using a reliever medicine more than 2-3 times per week.  Your peak flow is still at 50-79% of your personal best after following your action plan for 1 hour.  You have a fever. SEEK IMMEDIATE MEDICAL CARE IF:   You seem to be getting worse and are unresponsive to treatment during an asthma attack.  You are short of breath even at rest.  You get short of breath when doing very little physical activity.  You have difficulty eating, drinking, or talking due to asthma symptoms.  You develop chest pain.  You develop a fast heartbeat.  You have a bluish color to your lips or fingernails.  You are light-headed, dizzy, or faint.  Your peak flow is less than 50% of your personal best.   This information is not intended to replace advice given to you by your health care provider. Make sure you discuss any  questions you have with your health care provider.   Document Released: 12/28/2004 Document Revised: 09/18/2014 Document Reviewed: 07/27/2012 Elsevier Interactive Patient Education 2016 Elsevier Inc.  Albuterol inhalation aerosol What is this medicine? ALBUTEROL (al Gaspar Bidding) is a bronchodilator. It helps open up the airways in your lungs to make it easier to breathe. This medicine is used to treat and to prevent bronchospasm. This medicine may be used for other purposes; ask your health care provider or pharmacist if you have questions. What should I tell my health care provider before I take this medicine? They need to know if you have any of the following conditions: -diabetes -heart disease or irregular heartbeat -high blood pressure -pheochromocytoma -seizures -thyroid disease -an unusual or allergic reaction to albuterol, levalbuterol, sulfites, other medicines, foods, dyes, or preservatives -pregnant or trying to get pregnant -breast-feeding How should I use this medicine? This medicine is for inhalation through the mouth. Follow the directions on your prescription label. Take your medicine at regular intervals. Do not use more often than directed. Make sure that you are using your inhaler correctly. Ask you doctor or health care provider if you have any questions. Talk to your pediatrician  regarding the use of this medicine in children. Special care may be needed. Overdosage: If you think you have taken too much of this medicine contact a poison control center or emergency room at once. NOTE: This medicine is only for you. Do not share this medicine with others. What if I miss a dose? If you miss a dose, use it as soon as you can. If it is almost time for your next dose, use only that dose. Do not use double or extra doses. What may interact with this medicine? -anti-infectives like chloroquine and pentamidine -caffeine -cisapride -diuretics -medicines for  colds -medicines for depression or for emotional or psychotic conditions -medicines for weight loss including some herbal products -methadone -some antibiotics like clarithromycin, erythromycin, levofloxacin, and linezolid -some heart medicines -steroid hormones like dexamethasone, cortisone, hydrocortisone -theophylline -thyroid hormones This list may not describe all possible interactions. Give your health care provider a list of all the medicines, herbs, non-prescription drugs, or dietary supplements you use. Also tell them if you smoke, drink alcohol, or use illegal drugs. Some items may interact with your medicine. What should I watch for while using this medicine? Tell your doctor or health care professional if your symptoms do not improve. Do not use extra albuterol. If your asthma or bronchitis gets worse while you are using this medicine, call your doctor right away. If your mouth gets dry try chewing sugarless gum or sucking hard candy. Drink water as directed. What side effects may I notice from receiving this medicine? Side effects that you should report to your doctor or health care professional as soon as possible: -allergic reactions like skin rash, itching or hives, swelling of the face, lips, or tongue -breathing problems -chest pain -feeling faint or lightheaded, falls -high blood pressure -irregular heartbeat -fever -muscle cramps or weakness -pain, tingling, numbness in the hands or feet -vomiting Side effects that usually do not require medical attention (report to your doctor or health care professional if they continue or are bothersome): -cough -difficulty sleeping -headache -nervousness or trembling -stomach upset -stuffy or runny nose -throat irritation -unusual taste This list may not describe all possible side effects. Call your doctor for medical advice about side effects. You may report side effects to FDA at 1-800-FDA-1088. Where should I keep my  medicine? Keep out of the reach of children. Store at room temperature between 15 and 30 degrees C (59 and 86 degrees F). The contents are under pressure and may burst when exposed to heat or flame. Do not freeze. This medicine does not work as well if it is too cold. Throw away any unused medicine after the expiration date. Inhalers need to be thrown away after the labeled number of puffs have been used or by the expiration date; whichever comes first. Ventolin HFA should be thrown away 12 months after removing from foil pouch. Check the instructions that come with your medicine. NOTE: This sheet is a summary. It may not cover all possible information. If you have questions about this medicine, talk to your doctor, pharmacist, or health care provider.    2016, Elsevier/Gold Standard. (2012-06-15 10:57:17)  [employer] 211 is a great source of information about community services available.  Access by dialing 2-1-1 from anywhere in [place], or by website -  PooledIncome.pl.   Other Local Resources (Updated 01/2015)  Scientist, forensic    Phone Number and Address  [facility]  Low-cost medical care -  1st and 3rd Saturday of every month  Must not qualify for public or private insurance and must have limited income [phone] [address] [place], [place]    [place] [employer] Social Services  Child care  Emergency assistance for housing and [employer]ark[employer]ilities  Food stamps  Medicaid [phone] [address] [place], [place] [address]   [facility]  Low-cost medical care for children, communicable diseases, sexually-transmitted diseases, immunizations, maternity care, womens health and family planning [phone] [address] [place], [place] [address]  [facility]   Medication assistance for  [facility] residents  Must meet income requirements [phone] [address] [place], [place].    [facility]  Child care  Emergency assistance for housing and [employer]ark[employer]ilities  Food stamps  Medicaid [phone] [address] [place], [place] [address]  [facility]   Low-cost medical care,   Monday through Friday, 9 am to 6 pm.   Accepts Medicare/Medicaid, and self-pay [phone] [address] [place], [place] [address]  [facility]  Low-cost medical care - Monday through Friday, 8:30 am - 5:30 pm  Accepts Medicaid and self-pay [phone] [address] [place], [place] [address]   [facility]  Primary medical care, including for those with sickle cell disease  Accepts Medicare, Medicaid, insurance and self-pay [phone] [address] [place], [place]  [facility]   Primary medical care  Accepts Medicare, [place], insurance and self-pay [phone] [address] [place], [place] [address]   [facility] of Social Services  Child care  Emergency assistance for housing and [employer]ark[employer]ilities  Food stamps  Medicaid [phone] [address] [place], [place] [address]  [facility] Department of Health and [employer]Human Services  Child care  Emergency assistance for housing and [employer]ly[employer]ark[employer]ilities  Food stamps  Medicaid [phone] [address] [place], [place] [address]   [facility]  Medication assistance for [facility] residents with no insurance only  Must have a primary care doctor [phone] [address] [place], [place]  [facility]   Primary medical care  [place] Medicare, [place], insurance  [phone] [address] [place], [place]  [facility]ist   Medication assistance [phone]  [facility]   Primary medical care  Accepts Medicare, [place], insurance and self-pay [phone] [address] [place], [place] [address]  [facility]   Primary medical care  Accepts Medicare, [place], insurance and self-pay [phone] [address] [place], [place] [address]  [facility]  For [place] residents between the ages of 218 and 4364 who do not have any form of health insurance, Medicare, [place], or [place] benefits.  Services are provided free of charge to uninsured patients who fall within federal poverty guidelines.    Hours: Tuesdays and Thursdays, 4:15 - 8 pm [phone] [address], [address] [place], [place] [address]  [facility]     Primary medical care  Dental care  Nutritional counseling  Pharmacy  Accepts Medicaid, Medicare, most insurance.  Fees are adjusted based on ability to pay.   [phone] [facility] [address] [place], [place]  [phone] [facility]as Realharles Drew [facility] [address] [place], [place]  [phone] [facility] [place], [place]  [phone] [facility], [address] [place], [place]  [phone] [facility] [address] [place], [place]  [facility]  Womens health and family planning [phone] [address] [place], [place]  [facility] Department of Social Services  Child care  Emergency assistance for housing and [employer]  Medicaid [phone] [address], [place], [place] [address]   [facility]    Ages 102 and older  Hours: Mondays and Thursdays, 7:00 am - 9:00 am Patients are seen on a first come, first served basis. [phone], [phone] [place], [place]  [facility] Division of Social Services  Child care  Emergency assistance for housing and [employer]  Medicaid [phone] [address] [place], [place] [address]  [facility] [facility]  Medication assistance  Rental assistance  Food pantry  Medication assistance  Housing assistance  Emergency food distribution  Utility assistance [phone] [address] [place], [place]  [phone]  [address] [place], [place] [address] Hours: Tuesdays and Thursdays from 9am - 12 noon by appointment only  [phone] [address] [place], [place] [address]  [facility] - Lanae Boast   Accepts private insurance, [place], and [place].  Payment is based on a sliding scale for those without insurance.  Hours: Mondays, Tuesdays and Thursdays, 8:30 am - 5:30 pm.   [phone] [address] [place], [place]  [facility] - Family Medicine at [facility], [place], and [place].  Payment is based on a sliding scale for those without insurance. [phone] [address] [place], [place]  [facility] - Pediatrics at E. Scientist, research (physical sciences), [employer], and [place].  Payment is based on a sliding scale for those without insurance [phone] [address], [employer], [place]  [facility] - Pediatrics at [employer], [place], and [place].  Payment is based on a sliding scale for those without insurance. [phone] [address] [place], [place]  [facility] - Pediatrics at [facility], [place], and [place].  Payment is based on a sliding scale for those without insurance. [phone], [phone] [place], [place].    [facility]  Maternity care.  Accepts Medicaid and self-pay. [phone] [address] [place], [place]

## 2015-05-03 NOTE — ED Notes (Signed)
Pt comes to Ed dropped off by boyfriend, pt is actively wheezing.  lost her asthma inhaler this afternoon and reports having SOB for about two hours now. Pt has a hx of asthma is a non smoker.  Pt reports a heaviness on her chest. Pt is calm and diminished sounds are heard on ausculation.

## 2015-06-01 ENCOUNTER — Encounter (HOSPITAL_COMMUNITY): Payer: Self-pay | Admitting: Emergency Medicine

## 2015-06-01 ENCOUNTER — Emergency Department (HOSPITAL_COMMUNITY)
Admission: EM | Admit: 2015-06-01 | Discharge: 2015-06-02 | Disposition: A | Discharge status: Home / Self Care | Payer: Self-pay | Attending: Emergency Medicine | Admitting: Emergency Medicine

## 2015-06-01 DIAGNOSIS — A599 Trichomoniasis, unspecified: Secondary | ICD-10-CM | POA: Insufficient documentation

## 2015-06-01 DIAGNOSIS — Z202 Contact with and (suspected) exposure to infections with a predominantly sexual mode of transmission: Secondary | ICD-10-CM | POA: Insufficient documentation

## 2015-06-01 DIAGNOSIS — Z79899 Other long term (current) drug therapy: Secondary | ICD-10-CM | POA: Insufficient documentation

## 2015-06-01 DIAGNOSIS — J4521 Mild intermittent asthma with (acute) exacerbation: Secondary | ICD-10-CM | POA: Insufficient documentation

## 2015-06-01 NOTE — ED Notes (Signed)
Pt states that she is out of her inhaler but also was exposed to syphyllis by her boyfriend who tested positive last week. Denies vaginal discharge. Alert and oriented.

## 2015-06-02 LAB — WET PREP, GENITAL
SPERM: NONE SEEN
YEAST WET PREP: NONE SEEN

## 2015-06-02 LAB — GC/CHLAMYDIA PROBE AMP (~~LOC~~) NOT AT ARMC
CHLAMYDIA, DNA PROBE: NEGATIVE
NEISSERIA GONORRHEA: NEGATIVE

## 2015-06-02 LAB — HIV ANTIBODY (ROUTINE TESTING W REFLEX): HIV SCREEN 4TH GENERATION: NONREACTIVE

## 2015-06-02 MED ORDER — PENICILLIN G BENZATHINE 1200000 UNIT/2ML IM SUSP
2.4000 10*6.[IU] | Freq: Once | INTRAMUSCULAR | Status: AC
  Administered 2015-06-02: 2.4 10*6.[IU] via INTRAMUSCULAR
  Filled 2015-06-02: qty 4

## 2015-06-02 MED ORDER — PREDNISONE 20 MG PO TABS
60.0000 mg | ORAL_TABLET | Freq: Once | ORAL | Status: AC
  Administered 2015-06-02: 60 mg via ORAL
  Filled 2015-06-02: qty 3

## 2015-06-02 MED ORDER — IPRATROPIUM-ALBUTEROL 0.5-2.5 (3) MG/3ML IN SOLN
RESPIRATORY_TRACT | Status: AC
  Administered 2015-06-02: 3 mL
  Filled 2015-06-02: qty 3

## 2015-06-02 MED ORDER — ONDANSETRON 8 MG PO TBDP
8.0000 mg | ORAL_TABLET | Freq: Once | ORAL | Status: AC
  Administered 2015-06-02: 8 mg via ORAL
  Filled 2015-06-02: qty 1

## 2015-06-02 MED ORDER — CEFTRIAXONE SODIUM 250 MG IJ SOLR
250.0000 mg | Freq: Once | INTRAMUSCULAR | Status: AC
  Administered 2015-06-02: 250 mg via INTRAMUSCULAR
  Filled 2015-06-02: qty 250

## 2015-06-02 MED ORDER — ALBUTEROL SULFATE HFA 108 (90 BASE) MCG/ACT IN AERS
2.0000 | INHALATION_SPRAY | RESPIRATORY_TRACT | Status: DC | PRN
  Filled 2015-06-02: qty 6.7

## 2015-06-02 MED ORDER — AZITHROMYCIN 250 MG PO TABS
1000.0000 mg | ORAL_TABLET | Freq: Once | ORAL | Status: AC
  Administered 2015-06-02: 1000 mg via ORAL
  Filled 2015-06-02: qty 4

## 2015-06-02 MED ORDER — IPRATROPIUM-ALBUTEROL 0.5-2.5 (3) MG/3ML IN SOLN
3.0000 mL | Freq: Once | RESPIRATORY_TRACT | Status: DC
  Filled 2015-06-02: qty 3

## 2015-06-02 MED ORDER — METRONIDAZOLE 500 MG PO TABS
2000.0000 mg | ORAL_TABLET | Freq: Once | ORAL | Status: AC
  Administered 2015-06-02: 2000 mg via ORAL
  Filled 2015-06-02: qty 4

## 2015-06-02 NOTE — ED Provider Notes (Signed)
CSN: [id number]     Arrival date & time 06/01/15  2245 History  By signing my name below, I, [name], attest that this documentation has been prepared under the direction and in the presence of Gilda Creasehristopher J Lauris Keepers, MD. Electronically Signed: [name], ED Scribe. 06/02/2015. 1:41 AM    Chief Complaint  Patient presents with  . Exposure to STD  . Asthma   The history is provided by the patient. No language interpreter was used.   Nicole White is a 25 y.o. female with PMHx of asthma  who presents to the Emergency Department complaining of constant chest tightness with onset yesterday. Associated symptoms include wheezing and mildly productive cough. She has had similar symptoms in the past with asthma and is out of her home inhalers.  Pt also complains of syphilis as her boyfriend tested positive last week. She denies any rash, new vaginal discharge, and abdominal pain.    Past Medical History  Diagnosis Date  . Asthma    Past Surgical History  Procedure Laterality Date  . No past surgeries     Family History  Problem Relation Age of Onset  . Asthma Father   . Autism Brother    Social History  Substance Use Topics  . Smoking status: Never Smoker   . Smokeless tobacco: Never Used  . Alcohol Use: No   OB History    No data available     Review of Systems  Respiratory: Positive for cough, chest tightness and wheezing.   Gastrointestinal: Negative for abdominal pain.  Genitourinary: Negative for vaginal discharge.  Skin: Negative for rash.      Allergies  Coconut flavor; Peanut-containing drug products; and Shellfish allergy  Home Medications   Prior to Admission medications   Medication Sig Start Date End Date Taking? Authorizing Provider  albuterol (PROVENTIL HFA;VENTOLIN HFA) 108 (90 BASE) MCG/ACT inhaler Inhale 1-2 puffs into the lungs every 6 (six) hours as needed for wheezing or shortness of breath. 04/16/14   Purvis SheffieldForrest Harrison, MD  naproxen sodium  (ANAPROX) 220 MG tablet Take 220 mg by mouth every 12 (twelve) hours as needed (for pain).    Historical Provider, MD   BP 125/85 mmHg  Pulse 78  Temp(Src) 98.6 F (37 C) (Oral)  Resp 18  SpO2 99%  LMP 05/24/2015 (Exact Date) Physical Exam  Constitutional: She is oriented to person, place, and time. She appears well-developed and well-nourished. No distress.  HENT:  Head: Normocephalic and atraumatic.  Right Ear: Hearing normal.  Left Ear: Hearing normal.  Nose: Nose normal.  Mouth/Throat: Oropharynx is clear and moist and mucous membranes are normal.  Eyes: Conjunctivae and EOM are normal. Pupils are equal, round, and reactive to light.  Neck: Normal range of motion. Neck supple.  Cardiovascular: Regular rhythm, S1 normal and S2 normal.  Exam reveals no gallop and no friction rub.   No murmur heard. Pulmonary/Chest: Effort normal. No respiratory distress. She exhibits no tenderness.  Very diminished bilaterally   Abdominal: Soft. Normal appearance and bowel sounds are normal. There is no hepatosplenomegaly. There is no tenderness. There is no rebound, no guarding, no tenderness at McBurney's point and negative Murphy's sign. No hernia.  Musculoskeletal: Normal range of motion.  Neurological: She is alert and oriented to person, place, and time. She has normal strength. No cranial nerve deficit or sensory deficit. Coordination normal. GCS eye subscore is 4. GCS verbal subscore is 5. GCS motor subscore is 6.  Skin: Skin is warm, dry and  intact. No rash noted. No cyanosis.  Psychiatric: She has a normal mood and affect. Her speech is normal and behavior is normal. Thought content normal.  Nursing note and vitals reviewed.   ED Course  Procedures (including critical care time) DIAGNOSTIC STUDIES: Oxygen Saturation is 99% on RA,  normal by my interpretation.    COORDINATION OF CARE: 1:34 AM Discussed treatment plan which includes a breathing treatment, lab work, pelvic exam, and  empiric STD treatment with pt at bedside and pt agreed to plan.  Labs Review Labs Reviewed  WET PREP, GENITAL - Abnormal; Notable for the following:    Trich, Wet Prep PRESENT (*)    Clue Cells Wet Prep HPF POC PRESENT (*)    WBC, Wet Prep HPF POC RARE (*)    All other components within normal limits  RPR  HIV ANTIBODY (ROUTINE TESTING)  GC/CHLAMYDIA PROBE AMP ([place]) NOT AT [facility]    Imaging Review No results found. I have personally reviewed and evaluated these images and lab results as part of my medical decision-making.   EKG Interpretation None      MDM   Final diagnoses:  Exposure to syphilis  Trichomoniasis  Asthma, mild intermittent, with acute exacerbation    Patient presents with multiple complaints. Patient complaining of wheezing and shortness of breath. She does have a history of asthma but is out of her inhaler. She did have mild bronchospasm present upon arrival, improved with DuoNeb.  Patient also complains of syphilis exposure. She reports that her boyfriend has been diagnosed with syphilis. Pelvic examination did not reveal any significant abnormality, however, wet prep did show trichomonas. She was therefore treated empirically for GC, chlamydia as well as syphilis. She talked to her boyfriend and he knew about the trichomonas as well, did not tell her. He was reportedly negative for GC chlamydia, however.  I personally performed the services described in this documentation, which was scribed in my presence. The recorded information has been reviewed and is accurate.    Gilda Crease, MD 06/02/15 [phone]

## 2015-06-02 NOTE — Discharge Instructions (Signed)
Trichomonas Test The trichomonas test is done to diagnose trichomoniasis, an infection caused by an organism called Trichomonas. Trichomoniasis is a sexually transmitted infection (STI). In women, it causes vaginal infections. In men, it can cause the tube that carries urine (urethra) to become inflamed (urethritis). You may have this test as a part of a routine screening for STIs or if you have symptoms of trichomoniasis. To perform the test, your health care provider will take a sample of discharge. The sample is taken from the vagina or cervix in women and from the urethra in men. A urine sample can also be used for testing. RESULTS It is your responsibility to obtain your test results. Ask the lab or department performing the test when and how you will get your results. Contact your health care provider to discuss any questions you have about your results.  Meaning of Negative Test Results A negative test means you do not have trichomoniasis. Follow your health care provider's directions about any follow-up testing.  Meaning of Positive Test Results A positive test result means you have an active infection that needs to be treated with antibiotic medicine. All your current sexual partners must also be treated or it is likely you will get reinfected.  If your test is positive, your health care provider will start you on medicine and may advise you to:  Not have sexual intercourse until your infection has cleared up.  Use a latex condom properly every time you have sexual intercourse.  Limit the number of sexual partners you have. The more partners you have, the greater your risk of contracting trichomoniasis or another STI.  Tell all sexual partners about your infection so they can also be treated and to prevent reinfection.   This information is not intended to replace advice given to you by your health care provider. Make sure you discuss any questions you have with your health care  provider.   Document Released: 01/31/2004 Document Revised: 01/18/2014 Document Reviewed: 01/09/2013 Elsevier Interactive Patient Education 2016 [employer].  Safe Sex Safe sex is about reducing the risk of giving or getting a sexually transmitted disease (STD). STDs are spread through sexual contact involving the genitals, mouth, or rectum. Some STDs can be cured and others cannot. Safe sex can also prevent unintended pregnancies.  WHAT ARE SOME SAFE SEX PRACTICES?  Limit your sexual activity to only one partner who is having sex with only you.  Talk to your partner about his or her past partners, past STDs, and drug use.  Use a condom every time you have sexual intercourse. This includes vaginal, oral, and anal sexual activity. Both females and males should wear condoms during oral sex. Only use latex or polyurethane condoms and water-based lubricants. Using petroleum-based lubricants or oils to lubricate a condom will weaken the condom and increase the chance that it will break. The condom should be in place from the beginning to the end of sexual activity. Wearing a condom reduces, but does not completely eliminate, your risk of getting or giving an STD. STDs can be spread by contact with infected body fluids and skin.  Get vaccinated for hepatitis B and HPV.  Avoid alcohol and recreational drugs, which can affect your judgment. You may forget to use a condom or participate in high-risk sex.  For females, avoid douching after sexual intercourse. Douching can spread an infection farther into the reproductive tract.  Check your body for signs of sores, blisters, rashes, or unusual discharge. See your health  care provider if you notice any of these signs.  Avoid sexual contact if you have symptoms of an infection or are being treated for an STD. If you or your partner has herpes, avoid sexual contact when blisters are present. Use condoms at all other times.  If you are at risk of being  infected with HIV, it is recommended that you take a prescription medicine daily to prevent HIV infection. This is called pre-exposure prophylaxis (PrEP). You are considered at risk if:  You are a man who has sex with other men (MSM).  You are a heterosexual man or woman who is sexually active with more than one partner.  You take drugs by injection.  You are sexually active with a partner who has HIV.  Talk with your health care provider about whether you are at high risk of being infected with HIV. If you choose to begin PrEP, you should first be tested for HIV. You should then be tested every 3 months for as long as you are taking PrEP.  See your health care provider for regular screenings, exams, and tests for other STDs. Before having sex with a new partner, each of you should be screened for STDs and should talk about the results with each other. WHAT ARE THE BENEFITS OF SAFE SEX?   There is less chance of getting or giving an STD.  You can prevent unwanted or unintended pregnancies.  By discussing safe sex concerns with your partner, you may increase feelings of intimacy, comfort, trust, and honesty between the two of you.   This information is not intended to replace advice given to you by your health care provider. Make sure you discuss any questions you have with your health care provider.   Document Released: 02/05/2004 Document Revised: 01/18/2014 Document Reviewed: 06/21/2011 Elsevier Interactive Patient Education [employer]2016 Elsevier Inc.

## 2015-06-02 NOTE — ED Notes (Signed)
Pelvic cart set up at bedside  
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
## 2015-06-03 ENCOUNTER — Telehealth: Payer: Self-pay | Admitting: *Deleted

## 2015-06-03 LAB — RPR, QUANT+TP ABS (REFLEX): TREPONEMA PALLIDUM AB: POSITIVE — AB

## 2015-06-03 LAB — RPR: RPR Ser Ql: REACTIVE — AB

## 2015-06-04 ENCOUNTER — Telehealth (HOSPITAL_BASED_OUTPATIENT_CLINIC_OR_DEPARTMENT_OTHER): Payer: Self-pay | Admitting: Emergency Medicine

## 2015-06-25 ENCOUNTER — Telehealth (HOSPITAL_BASED_OUTPATIENT_CLINIC_OR_DEPARTMENT_OTHER): Payer: Self-pay | Admitting: Emergency Medicine

## 2015-06-25 NOTE — Telephone Encounter (Signed)
Letter returned no forwarding address, lost followup

## 2018-01-18 ENCOUNTER — Emergency Department (HOSPITAL_COMMUNITY)
Admission: EM | Admit: 2018-01-18 | Discharge: 2018-01-18 | Disposition: A | Discharge status: Home / Self Care | Payer: Self-pay | Attending: Emergency Medicine | Admitting: Emergency Medicine

## 2018-01-18 ENCOUNTER — Other Ambulatory Visit: Payer: Self-pay

## 2018-01-18 ENCOUNTER — Encounter (HOSPITAL_COMMUNITY): Payer: Self-pay | Admitting: Emergency Medicine

## 2018-01-18 DIAGNOSIS — J4541 Moderate persistent asthma with (acute) exacerbation: Secondary | ICD-10-CM | POA: Insufficient documentation

## 2018-01-18 DIAGNOSIS — Z79899 Other long term (current) drug therapy: Secondary | ICD-10-CM | POA: Insufficient documentation

## 2018-01-18 DIAGNOSIS — Z91010 Allergy to peanuts: Secondary | ICD-10-CM | POA: Insufficient documentation

## 2018-01-18 LAB — BASIC METABOLIC PANEL
Anion gap: 9 (ref 5–15)
BUN: 9 mg/dL (ref 6–20)
CHLORIDE: 110 mmol/L (ref 98–111)
CO2: 23 mmol/L (ref 22–32)
CREATININE: 0.85 mg/dL (ref 0.44–1.00)
Calcium: 8.6 mg/dL — ABNORMAL LOW (ref 8.9–10.3)
GFR calc Af Amer: >60 mL/min (ref >60)
GFR calc non Af Amer: >60 mL/min (ref >60)
GLUCOSE: 140 mg/dL — AB (ref 70–99)
POTASSIUM: 3.6 mmol/L (ref 3.5–5.1)
SODIUM: 142 mmol/L (ref 135–145)

## 2018-01-18 LAB — CBC
HEMATOCRIT: 45.3 % (ref 36.0–46.0)
HEMOGLOBIN: 14.6 g/dL (ref 12.0–15.0)
MCH: 26.8 pg (ref 26.0–34.0)
MCHC: 32.2 g/dL (ref 30.0–36.0)
MCV: 83.1 fL (ref 80.0–100.0)
Platelets: 205 10*3/uL (ref 150–400)
RBC: 5.45 MIL/uL — ABNORMAL HIGH (ref 3.87–5.11)
RDW: 13.4 % (ref 11.5–15.5)
WBC: 14 10*3/uL — ABNORMAL HIGH (ref 4.0–10.5)
nRBC: 0 % (ref 0.0–0.2)

## 2018-01-18 LAB — I-STAT BETA HCG BLOOD, ED (MC, WL, AP ONLY): I-stat hCG, quantitative: <5 m[IU]/mL (ref <5)

## 2018-01-18 MED ORDER — ALBUTEROL SULFATE (2.5 MG/3ML) 0.083% IN NEBU: 2.5000 mg | INHALATION_SOLUTION | RESPIRATORY_TRACT | 12 refills | Status: DC | PRN

## 2018-01-18 MED ORDER — ALBUTEROL SULFATE HFA 108 (90 BASE) MCG/ACT IN AERS: 2.0000 | INHALATION_SPRAY | RESPIRATORY_TRACT | 1 refills | Status: AC | PRN

## 2018-01-18 MED ORDER — PREDNISONE 20 MG PO TABS: 40.0000 mg | ORAL_TABLET | Freq: Every day | ORAL | 0 refills | Status: AC

## 2018-01-18 MED ORDER — ALBUTEROL (5 MG/ML) CONTINUOUS INHALATION SOLN
10.0000 mg/h | INHALATION_SOLUTION | Freq: Once | RESPIRATORY_TRACT | Status: AC
  Administered 2018-01-18: 10 mg/h via RESPIRATORY_TRACT
  Filled 2018-01-18: qty 20

## 2018-01-18 NOTE — ED Notes (Signed)
RT notified of orders 

## 2018-01-18 NOTE — ED Triage Notes (Signed)
Per EMS patient from home with shortness of breath.  States using inhaler without relief.  HR of 140.  Expiratory and inspiratory wheezes.  Patient given solumedrol 125mg , 10 albuterol, 1 atrovent, 2 gm magnesium via EMS.

## 2018-01-18 NOTE — Discharge Instructions (Addendum)
I have given you a prescription for steroids today.  Some common side effects include feelings of extra energy, feeling warm, increased appetite, and stomach upset.  If you are diabetic your sugars may run higher than usual.   As we discussed today your heart rate is elevated.  This is most likely from your albuterol however other, possibly serious, conditions can cause this also.  Please check your heart rate in the morning.  If it is over 100 beats per minute then you should get seen again.

## 2018-01-18 NOTE — ED Notes (Signed)
Bed: WA04 Expected date:  Expected time:  Means of arrival:  Comments: EMS/shob

## 2018-01-18 NOTE — ED Provider Notes (Signed)
[facility]-EMERGENCY DEPT Provider Note   CSN: [id number] Arrival date & time: 01/18/18  1816     History   Chief Complaint Chief Complaint  Patient presents with  . Shortness of Breath    HPI Deandra Lengacher is a 28 y.o. female with a past medical history of asthma who presents today for evaluation of an asthma exacerbation.  She reports that this feels like her normal asthma.  She has tried her inhaler and nebulizer at home without relief.  She denies any recent fevers.  Her breathing worsened this morning at around 6, she is in the process of moving and thinks that it may be due to allergies to cats or their litter.    EMS gave her 125 mg Solu-Medrol, 10 mg albuterol, atrovent, and 2 g of magnesium.  She reports that she feels better however is still tight.  She has required bipap and admission for her asthma, never intubation.    HPI  Past Medical History:  Diagnosis Date  . Asthma     Patient Active Problem List   Diagnosis Date Noted  . Asthma exacerbation 12/21/2012  . Acute bronchitis with bronchospasm 12/21/2012  . Hypokalemia 12/21/2012  . Leukocytosis 12/21/2012  . Sinus tachycardia 12/21/2012    Past Surgical History:  Procedure Laterality Date  . NO PAST SURGERIES       OB History   No obstetric history on file.      Home Medications    Prior to Admission medications   Medication Sig Start Date End Date Taking? Authorizing Provider  fluticasone furoate-vilanterol (BREO ELLIPTA) 200-25 MCG/INH AEPB Inhale 1 puff into the lungs daily. 12/01/17 12/01/18 Yes [provider]  naproxen sodium (ANAPROX) 220 MG tablet Take 220 mg by mouth every 12 (twelve) hours as needed (for pain).   Yes [provider]  omeprazole (PRILOSEC) 20 MG capsule Take 20 mg by mouth daily. 12/01/17 03/01/18 Yes [provider]  albuterol (PROVENTIL HFA;VENTOLIN HFA) 108 (90 Base) MCG/ACT inhaler Inhale 2 puffs into the lungs every 4  (four) hours as needed for wheezing or shortness of breath. 01/18/18   Cristina Gong, PA-C  albuterol (PROVENTIL) (2.5 MG/3ML) 0.083% nebulizer solution Take 3 mLs (2.5 mg total) by nebulization every 4 (four) hours as needed for wheezing or shortness of breath. 01/18/18   Cristina Gong, PA-C  predniSONE (DELTASONE) 20 MG tablet Take 2 tablets (40 mg total) by mouth daily for 5 days. 01/18/18 01/23/18  Cristina Gong, PA-C    Family History Family History  Problem Relation Age of Onset  . Asthma Father   . Autism Brother     Social History Social History   Tobacco Use  . Smoking status: Never Smoker  . Smokeless tobacco: Never Used  Substance Use Topics  . Alcohol use: No  . Drug use: No     Allergies   Coconut flavor; Other; Peanut-containing drug products; and Shellfish allergy   Review of Systems Review of Systems  Constitutional: Negative for chills and fever.  HENT: Negative for congestion, sinus pressure, sinus pain and sore throat.   Respiratory: Positive for chest tightness, shortness of breath and wheezing.   Cardiovascular: Negative for chest pain, palpitations and leg swelling.  Gastrointestinal: Negative for abdominal pain, nausea and vomiting.  Neurological: Negative for dizziness.  All other systems reviewed and are negative.    Physical Exam Updated Vital Signs BP 128/90 (BP Location: Left Arm)   Pulse (!) 110 Comment:  Pt reports her HR is normally high after a hour long breathing treatment  Temp 98.3 F (36.8 C) (Oral)   Resp 19   Ht 5\' 2"  (1.575 m)   Wt 75.8 kg   LMP 01/18/2018   SpO2 99%   BMI 30.54 kg/m   Physical Exam Vitals signs and nursing note reviewed.  Constitutional:      General: She is not in acute distress.    Appearance: She is well-developed.  HENT:     Head: Normocephalic and atraumatic.     Mouth/Throat:     Mouth: Mucous membranes are moist.  Eyes:     Conjunctiva/sclera: Conjunctivae normal.  Neck:      Musculoskeletal: Normal range of motion and neck supple.  Cardiovascular:     Rate and Rhythm: Normal rate and regular rhythm.     Heart sounds: No murmur.  Pulmonary:     Effort: Pulmonary effort is normal. No accessory muscle usage or respiratory distress.     Breath sounds: Wheezing (Diffuse bilateral inspiratory and expiratory wheezes.) present. No rhonchi or rales.  Abdominal:     Palpations: Abdomen is soft.     Tenderness: There is no abdominal tenderness.  Musculoskeletal:     Right lower leg: She exhibits no tenderness. No edema.     Left lower leg: She exhibits no tenderness. No edema.  Skin:    General: Skin is warm and dry.  Neurological:     General: No focal deficit present.     Mental Status: She is alert.      ED Treatments / Results  Labs (all labs ordered are listed, but only abnormal results are displayed) Labs Reviewed  BASIC METABOLIC PANEL - Abnormal; Notable for the following components:      Result Value   Glucose, Bld 140 (*)    Calcium 8.6 (*)    All other components within normal limits  CBC - Abnormal; Notable for the following components:   WBC 14.0 (*)    RBC 5.45 (*)    All other components within normal limits  I-STAT BETA HCG BLOOD, ED ([facility], [facility], [facility] ONLY)    EKG None  Radiology No results found.  Procedures Procedures (including critical care time)  Medications Ordered in ED Medications  albuterol (PROVENTIL,VENTOLIN) solution continuous neb (10 mg/hr Nebulization Given 01/18/18 1922)     Initial Impression / Assessment and Plan / ED Course  I have reviewed the triage vital signs and the nursing notes.  Pertinent labs & imaging results that were available during my care of the patient were reviewed by me and considered in my medical decision making (see chart for details).  Clinical Course as of Jan 20 44  Wed Jan 18, 2018  1908 Diffuse wheezes inspiratory and expiratory.     [EH]  2125 Patient reevaluated, lungs are clear  to auscultation bilaterally.  Her heart rate remains elevated however she has had a significant amount of albuterol.  We did discuss possibility of PE and inability to rule her out.  She understands these risks, feels like this is her asthma and declines additional evaluation.    [EH]    Clinical Course User Index [EH] Cristina GongHammond, Elizabeth W, PA-C   Patient presents today for evaluation of shortness of breath.  She has a longstanding history of asthma and feels like this is a usual asthma exacerbation for her.  She was given 125 mg Solu-Medrol, 10 mg of albuterol, Atrovent, 2 g of magnesium by EMS.  Unsure what heart rate was prior to albuterol treatment.  On initial exam patient still had diffuse inspiratory and expiratory wheezes.  She was given a hour-long albuterol treatment after which she felt better.  Labs were obtained during albuterol treatment given concern for possible admission with her history.  Her white count is slightly elevated, however she did get prednisone and says this is normal for him when she has an asthma flare.  Otherwise she does not have significant electrolyte or hematologic abnormalities and is not pregnant.  I had a discussion with patient given that her heart rate is elevated unable to apply d-dimer criteria.  Given that she has a history of asthma and says this feels the same, and she improved with typical asthma treatment low suspicion for PE.  Did discuss with her risks of untreated PE, she states her understanding and does not wish for d-dimer or additional evaluation.    Return precautions were discussed with patient who states their understanding.  At the time of discharge patient denied any unaddressed complaints or concerns.  Patient is agreeable for discharge home.  Final Clinical Impressions(s) / ED Diagnoses   Final diagnoses:  Moderate persistent asthma with exacerbation    ED Discharge Orders         Ordered    albuterol (PROVENTIL) (2.5 MG/3ML) 0.083%  nebulizer solution  Every 4 hours PRN     01/18/18 2128    predniSONE (DELTASONE) 20 MG tablet  Daily     01/18/18 2128    albuterol (PROVENTIL HFA;VENTOLIN HFA) 108 (90 Base) MCG/ACT inhaler  Every 4 hours PRN     01/18/18 2128           Cristina GongHammond, Elizabeth W, PA-C 01/19/18 0049    Benjiman CorePickering, Nathan, MD 01/20/18 0003

## 2018-04-11 ENCOUNTER — Encounter (HOSPITAL_COMMUNITY): Payer: Self-pay | Admitting: Emergency Medicine

## 2018-04-11 ENCOUNTER — Other Ambulatory Visit: Payer: Self-pay

## 2018-04-11 ENCOUNTER — Emergency Department (HOSPITAL_COMMUNITY): Payer: No Typology Code available for payment source

## 2018-04-11 ENCOUNTER — Emergency Department (HOSPITAL_COMMUNITY)
Admission: EM | Admit: 2018-04-11 | Discharge: 2018-04-12 | Disposition: A | Discharge status: Home / Self Care | Payer: No Typology Code available for payment source | Attending: Emergency Medicine | Admitting: Emergency Medicine

## 2018-04-11 DIAGNOSIS — Y929 Unspecified place or not applicable: Secondary | ICD-10-CM | POA: Diagnosis not present

## 2018-04-11 DIAGNOSIS — M25522 Pain in left elbow: Secondary | ICD-10-CM | POA: Diagnosis not present

## 2018-04-11 DIAGNOSIS — M25512 Pain in left shoulder: Secondary | ICD-10-CM | POA: Insufficient documentation

## 2018-04-11 DIAGNOSIS — Y999 Unspecified external cause status: Secondary | ICD-10-CM | POA: Insufficient documentation

## 2018-04-11 DIAGNOSIS — Z79899 Other long term (current) drug therapy: Secondary | ICD-10-CM | POA: Insufficient documentation

## 2018-04-11 DIAGNOSIS — Z91010 Allergy to peanuts: Secondary | ICD-10-CM | POA: Insufficient documentation

## 2018-04-11 DIAGNOSIS — Z8709 Personal history of other diseases of the respiratory system: Secondary | ICD-10-CM | POA: Insufficient documentation

## 2018-04-11 DIAGNOSIS — M25532 Pain in left wrist: Secondary | ICD-10-CM | POA: Diagnosis not present

## 2018-04-11 DIAGNOSIS — Y939 Activity, unspecified: Secondary | ICD-10-CM | POA: Diagnosis not present

## 2018-04-11 DIAGNOSIS — T07XXXA Unspecified multiple injuries, initial encounter: Secondary | ICD-10-CM | POA: Diagnosis present

## 2018-04-11 LAB — BASIC METABOLIC PANEL
ANION GAP: 10 (ref 5–15)
BUN: 7 mg/dL (ref 6–20)
CALCIUM: 9.3 mg/dL (ref 8.9–10.3)
CO2: 20 mmol/L — AB (ref 22–32)
Chloride: 104 mmol/L (ref 98–111)
Creatinine, Ser: 0.78 mg/dL (ref 0.44–1.00)
GFR calc non Af Amer: >60 mL/min (ref >60)
Glucose, Bld: 91 mg/dL (ref 70–99)
POTASSIUM: 3.9 mmol/L (ref 3.5–5.1)
Sodium: 134 mmol/L — ABNORMAL LOW (ref 135–145)

## 2018-04-11 LAB — CBC WITH DIFFERENTIAL/PLATELET
ABS IMMATURE GRANULOCYTES: 0.03 10*3/uL (ref 0.00–0.07)
BASOS ABS: 0.1 10*3/uL (ref 0.0–0.1)
Basophils Relative: 1 %
Eosinophils Absolute: 0.4 10*3/uL (ref 0.0–0.5)
Eosinophils Relative: 4 %
HEMATOCRIT: 46 % (ref 36.0–46.0)
HEMOGLOBIN: 14.4 g/dL (ref 12.0–15.0)
IMMATURE GRANULOCYTES: 0 %
Lymphocytes Relative: 35 %
Lymphs Abs: 3.5 10*3/uL (ref 0.7–4.0)
MCH: 26.3 pg (ref 26.0–34.0)
MCHC: 31.3 g/dL (ref 30.0–36.0)
MCV: 84.1 fL (ref 80.0–100.0)
MONO ABS: 0.6 10*3/uL (ref 0.1–1.0)
MONOS PCT: 6 %
NEUTROS ABS: 5.4 10*3/uL (ref 1.7–7.7)
NEUTROS PCT: 54 %
Platelets: 217 10*3/uL (ref 150–400)
RBC: 5.47 MIL/uL — ABNORMAL HIGH (ref 3.87–5.11)
RDW: 12.7 % (ref 11.5–15.5)
WBC: 9.9 10*3/uL (ref 4.0–10.5)
nRBC: 0 % (ref 0.0–0.2)

## 2018-04-11 LAB — I-STAT BETA HCG BLOOD, ED (MC, WL, AP ONLY): I-stat hCG, quantitative: <5 m[IU]/mL (ref <5)

## 2018-04-11 MED ORDER — HYDROCODONE-ACETAMINOPHEN 5-325 MG PO TABS: 1.0000 | ORAL_TABLET | Freq: Four times a day (QID) | ORAL | 0 refills | Status: AC | PRN

## 2018-04-11 MED ORDER — CYCLOBENZAPRINE HCL 10 MG PO TABS: 10.0000 mg | ORAL_TABLET | Freq: Two times a day (BID) | ORAL | 0 refills | Status: AC | PRN

## 2018-04-11 MED ORDER — HYDROCODONE-ACETAMINOPHEN 5-325 MG PO TABS
2.0000 | ORAL_TABLET | Freq: Once | ORAL | Status: AC
  Administered 2018-04-11: 2 via ORAL
  Filled 2018-04-11: qty 2

## 2018-04-11 MED ORDER — IBUPROFEN 800 MG PO TABS: 800.0000 mg | ORAL_TABLET | Freq: Three times a day (TID) | ORAL | 0 refills | Status: AC

## 2018-04-11 NOTE — ED Notes (Signed)
Patient transported to CT 
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
## 2018-04-11 NOTE — ED Notes (Signed)
Patient transported to x-ray. ?
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
## 2018-04-12 NOTE — ED Notes (Signed)
Patient verbalizes understanding of discharge instructions. Opportunity for questioning and answers were provided. Armband removed by staff, pt discharged from ED in wheelchair.  

## 2018-04-12 NOTE — ED Notes (Signed)
Pt ambulated with no assist. Pt states minimal pain in knee on ambulation.

## 2018-07-19 ENCOUNTER — Encounter (HOSPITAL_BASED_OUTPATIENT_CLINIC_OR_DEPARTMENT_OTHER): Payer: Self-pay | Admitting: *Deleted

## 2018-07-19 ENCOUNTER — Other Ambulatory Visit: Payer: Self-pay

## 2018-07-19 ENCOUNTER — Emergency Department (HOSPITAL_BASED_OUTPATIENT_CLINIC_OR_DEPARTMENT_OTHER)
Admission: EM | Admit: 2018-07-19 | Discharge: 2018-07-19 | Disposition: A | Discharge status: Home / Self Care | Payer: Self-pay | Attending: Emergency Medicine | Admitting: Emergency Medicine

## 2018-07-19 ENCOUNTER — Emergency Department (HOSPITAL_BASED_OUTPATIENT_CLINIC_OR_DEPARTMENT_OTHER): Payer: Self-pay

## 2018-07-19 DIAGNOSIS — Y999 Unspecified external cause status: Secondary | ICD-10-CM | POA: Insufficient documentation

## 2018-07-19 DIAGNOSIS — S60229A Contusion of unspecified hand, initial encounter: Secondary | ICD-10-CM

## 2018-07-19 DIAGNOSIS — Y929 Unspecified place or not applicable: Secondary | ICD-10-CM | POA: Insufficient documentation

## 2018-07-19 DIAGNOSIS — J45909 Unspecified asthma, uncomplicated: Secondary | ICD-10-CM | POA: Insufficient documentation

## 2018-07-19 DIAGNOSIS — S60221A Contusion of right hand, initial encounter: Secondary | ICD-10-CM | POA: Insufficient documentation

## 2018-07-19 DIAGNOSIS — Y939 Activity, unspecified: Secondary | ICD-10-CM | POA: Insufficient documentation

## 2018-07-19 DIAGNOSIS — Z79899 Other long term (current) drug therapy: Secondary | ICD-10-CM | POA: Insufficient documentation

## 2018-07-19 DIAGNOSIS — W19XXXA Unspecified fall, initial encounter: Secondary | ICD-10-CM | POA: Insufficient documentation

## 2018-07-19 NOTE — ED Provider Notes (Addendum)
[facility] EMERGENCY DEPARTMENT Provider Note   CSN: [id number] Arrival date & time: 07/19/18  1738    History   Chief Complaint Chief Complaint  Patient presents with  . Hand Injury    HPI Nicole White is a 28 y.o. female.     The history is provided by the patient.  Hand Injury Location:  Hand Hand location:  R hand Injury: yes   Pain details:    Quality:  Aching   Radiates to:  Does not radiate   Severity:  Mild Relieved by:  Nothing Worsened by:  Nothing Associated symptoms: no back pain and no decreased range of motion     Past Medical History:  Diagnosis Date  . Asthma     Patient Active Problem List   Diagnosis Date Noted  . Asthma exacerbation 12/21/2012  . Acute bronchitis with bronchospasm 12/21/2012  . Hypokalemia 12/21/2012  . Leukocytosis 12/21/2012  . Sinus tachycardia 12/21/2012    Past Surgical History:  Procedure Laterality Date  . NO PAST SURGERIES       OB History   No obstetric history on file.      Home Medications    Prior to Admission medications   Medication Sig Start Date End Date Taking? Authorizing Provider  albuterol (PROVENTIL HFA;VENTOLIN HFA) 108 (90 Base) MCG/ACT inhaler Inhale 2 puffs into the lungs every 4 (four) hours as needed for wheezing or shortness of breath. 01/18/18   Cristina GongHammond, Elizabeth W, PA-C  albuterol (PROVENTIL) (2.5 MG/3ML) 0.083% nebulizer solution Take 3 mLs (2.5 mg total) by nebulization every 4 (four) hours as needed for wheezing or shortness of breath. 01/18/18   Cristina GongHammond, Elizabeth W, PA-C  cyclobenzaprine (FLEXERIL) 10 MG tablet Take 1 tablet (10 mg total) by mouth 2 (two) times daily as needed for muscle spasms. 04/11/18   Roxy HorsemanBrowning, Robert, PA-C  fluticasone furoate-vilanterol (BREO ELLIPTA) 200-25 MCG/INH AEPB Inhale 1 puff into the lungs daily. 12/01/17 12/01/18  [provider]  HYDROcodone-acetaminophen (NORCO/VICODIN) 5-325 MG tablet Take 1-2 tablets by mouth every 6 (six)  hours as needed. 04/11/18   Roxy HorsemanBrowning, Robert, PA-C  ibuprofen (ADVIL,MOTRIN) 800 MG tablet Take 1 tablet (800 mg total) by mouth 3 (three) times daily. 04/11/18   Roxy HorsemanBrowning, Robert, PA-C  naproxen sodium (ANAPROX) 220 MG tablet Take 220 mg by mouth every 12 (twelve) hours as needed (for pain).    [provider]  omeprazole (PRILOSEC) 20 MG capsule Take 20 mg by mouth daily. 12/01/17 04/11/18  [provider]    Family History Family History  Problem Relation Age of Onset  . Asthma Father   . Autism Brother     Social History Social History   Tobacco Use  . Smoking status: Never Smoker  . Smokeless tobacco: Never Used  Substance Use Topics  . Alcohol use: No  . Drug use: No     Allergies   Coconut flavor, Other, Peanut-containing drug products, and Shellfish allergy   Review of Systems Review of Systems  Musculoskeletal: Positive for arthralgias. Negative for back pain and myalgias.  Neurological: Negative for weakness and numbness.     Physical Exam Updated Vital Signs BP 126/62   Pulse 99   Temp 98.2 F (36.8 C)   Resp 18   Ht 5' (1.524 m)   Wt 77.1 kg   SpO2 100%   BMI 33.20 kg/m   Physical Exam Musculoskeletal: Normal range of motion.        General: Tenderness (back of right  hand, no scaphoid tenderness) present. No swelling or deformity.  Skin:    General: Skin is warm.  Neurological:     Sensory: No sensory deficit.     Motor: No weakness.      ED Treatments / Results  Labs (all labs ordered are listed, but only abnormal results are displayed) Labs Reviewed - No data to display  EKG None  Radiology Dg Hand Complete Right  Result Date: 07/19/2018 CLINICAL DATA:  Pain status post fall.  Pain near fifth digit. EXAM: RIGHT HAND - COMPLETE 3+ VIEW COMPARISON:  None. FINDINGS: There is no evidence of fracture or dislocation. There is no evidence of arthropathy or other focal bone abnormality. Soft tissues are unremarkable.  IMPRESSION: Negative. Electronically Signed   By: Constance Holster M.D.   On: 07/19/2018 19:06    Procedures Procedures (including critical care time)  Medications Ordered in ED Medications - No data to display   Initial Impression / Assessment and Plan / ED Course  I have reviewed the triage vital signs and the nursing notes.  Pertinent labs & imaging results that were available during my care of the patient were reviewed by me and considered in my medical decision making (see chart for details).     Nicole White is a 27 year old who presents to the ED with right hand pain after fall.  Patient with minimal tenderness.  X-rays negative.  Neurovascularly and neuromuscularly intact.  No numbness, no tingling.  Good pulses.  No tenderness in the scaphoid area.  Likely bone contusion.  Discharged in good condition.  Recommend Tylenol Motrin ice.  This chart was dictated using voice recognition software.  Despite best efforts to proofread,  errors can occur which can change the documentation meaning.    Final Clinical Impressions(s) / ED Diagnoses   Final diagnoses:  Contusion of hand, unspecified laterality, initial encounter    ED Discharge Orders    None       Lennice Sites, DO 07/19/18 1911    Lennice Sites, DO 07/19/18 1911

## 2018-07-19 NOTE — Discharge Instructions (Addendum)
You do not have a bone fracture.  Recommend Tylenol, Motrin, ice.  Likely bone bruise.

## 2018-07-19 NOTE — ED Triage Notes (Signed)
Pt c/o right hand injury x 1 hr ago  

## 2019-08-10 ENCOUNTER — Encounter (HOSPITAL_BASED_OUTPATIENT_CLINIC_OR_DEPARTMENT_OTHER): Payer: Self-pay | Admitting: *Deleted

## 2019-08-10 ENCOUNTER — Other Ambulatory Visit: Payer: Self-pay

## 2019-08-10 ENCOUNTER — Emergency Department (HOSPITAL_BASED_OUTPATIENT_CLINIC_OR_DEPARTMENT_OTHER)
Admission: EM | Admit: 2019-08-10 | Discharge: 2019-08-10 | Disposition: A | Discharge status: Home / Self Care | Payer: Medicaid Other | Attending: Emergency Medicine | Admitting: Emergency Medicine

## 2019-08-10 DIAGNOSIS — R197 Diarrhea, unspecified: Secondary | ICD-10-CM | POA: Insufficient documentation

## 2019-08-10 DIAGNOSIS — R109 Unspecified abdominal pain: Secondary | ICD-10-CM | POA: Insufficient documentation

## 2019-08-10 DIAGNOSIS — Z5321 Procedure and treatment not carried out due to patient leaving prior to being seen by health care provider: Secondary | ICD-10-CM | POA: Insufficient documentation

## 2019-08-10 LAB — URINALYSIS, ROUTINE W REFLEX MICROSCOPIC
Bilirubin Urine: NEGATIVE
Glucose, UA: NEGATIVE mg/dL
Hgb urine dipstick: NEGATIVE
Ketones, ur: NEGATIVE mg/dL
Leukocytes,Ua: NEGATIVE
Nitrite: NEGATIVE
Protein, ur: NEGATIVE mg/dL
Specific Gravity, Urine: 1.02 (ref 1.005–1.030)
pH: 6 (ref 5.0–8.0)

## 2019-08-10 LAB — PREGNANCY, URINE: Preg Test, Ur: NEGATIVE

## 2019-08-10 MED ORDER — SODIUM CHLORIDE 0.9% FLUSH
3.0000 mL | Freq: Once | INTRAVENOUS | Status: DC
  Filled 2019-08-10: qty 3

## 2019-08-10 NOTE — ED Notes (Signed)
No response

## 2019-08-10 NOTE — ED Triage Notes (Signed)
diarrhea x 1 week, 2 times per day w abd pain

## 2020-04-16 ENCOUNTER — Other Ambulatory Visit: Payer: Self-pay

## 2020-04-16 ENCOUNTER — Emergency Department (HOSPITAL_BASED_OUTPATIENT_CLINIC_OR_DEPARTMENT_OTHER): Payer: Self-pay

## 2020-04-16 ENCOUNTER — Encounter (HOSPITAL_BASED_OUTPATIENT_CLINIC_OR_DEPARTMENT_OTHER): Payer: Self-pay

## 2020-04-16 ENCOUNTER — Emergency Department (HOSPITAL_BASED_OUTPATIENT_CLINIC_OR_DEPARTMENT_OTHER)
Admission: EM | Admit: 2020-04-16 | Discharge: 2020-04-16 | Disposition: A | Discharge status: Home / Self Care | Payer: Self-pay | Attending: Emergency Medicine | Admitting: Emergency Medicine

## 2020-04-16 DIAGNOSIS — Z91010 Allergy to peanuts: Secondary | ICD-10-CM | POA: Insufficient documentation

## 2020-04-16 DIAGNOSIS — R251 Tremor, unspecified: Secondary | ICD-10-CM | POA: Insufficient documentation

## 2020-04-16 DIAGNOSIS — R5383 Other fatigue: Secondary | ICD-10-CM | POA: Insufficient documentation

## 2020-04-16 DIAGNOSIS — R531 Weakness: Secondary | ICD-10-CM | POA: Insufficient documentation

## 2020-04-16 DIAGNOSIS — J45901 Unspecified asthma with (acute) exacerbation: Secondary | ICD-10-CM | POA: Insufficient documentation

## 2020-04-16 HISTORY — DX: Prediabetes: R73.03

## 2020-04-16 LAB — COMPREHENSIVE METABOLIC PANEL
ALT: 15 U/L (ref 0–44)
AST: 18 U/L (ref 15–41)
Albumin: 3.8 g/dL (ref 3.5–5.0)
Alkaline Phosphatase: 42 U/L (ref 38–126)
Anion gap: 10 (ref 5–15)
BUN: 10 mg/dL (ref 6–20)
CO2: 24 mmol/L (ref 22–32)
Calcium: 9.3 mg/dL (ref 8.9–10.3)
Chloride: 106 mmol/L (ref 98–111)
Creatinine, Ser: 0.79 mg/dL (ref 0.44–1.00)
GFR, Estimated: >60 mL/min (ref >60)
Glucose, Bld: 98 mg/dL (ref 70–99)
Potassium: 3.5 mmol/L (ref 3.5–5.1)
Sodium: 140 mmol/L (ref 135–145)
Total Bilirubin: 0.1 mg/dL — ABNORMAL LOW (ref 0.3–1.2)
Total Protein: 6.8 g/dL (ref 6.5–8.1)

## 2020-04-16 LAB — CBG MONITORING, ED: Glucose-Capillary: 121 mg/dL — ABNORMAL HIGH (ref 70–99)

## 2020-04-16 LAB — CBC WITH DIFFERENTIAL/PLATELET
Abs Immature Granulocytes: 0.02 10*3/uL (ref 0.00–0.07)
Basophils Absolute: 0.1 10*3/uL (ref 0.0–0.1)
Basophils Relative: 1 %
Eosinophils Absolute: 0.3 10*3/uL (ref 0.0–0.5)
Eosinophils Relative: 3 %
HCT: 39.9 % (ref 36.0–46.0)
Hemoglobin: 13.5 g/dL (ref 12.0–15.0)
Immature Granulocytes: 0 %
Lymphocytes Relative: 41 %
Lymphs Abs: 3.7 10*3/uL (ref 0.7–4.0)
MCH: 27 pg (ref 26.0–34.0)
MCHC: 33.8 g/dL (ref 30.0–36.0)
MCV: 79.8 fL — ABNORMAL LOW (ref 80.0–100.0)
Monocytes Absolute: 0.9 10*3/uL (ref 0.1–1.0)
Monocytes Relative: 10 %
Neutro Abs: 4.1 10*3/uL (ref 1.7–7.7)
Neutrophils Relative %: 45 %
Platelets: 314 10*3/uL (ref 150–400)
RBC: 5 MIL/uL (ref 3.87–5.11)
RDW: 13.8 % (ref 11.5–15.5)
WBC: 9.1 10*3/uL (ref 4.0–10.5)
nRBC: 0 % (ref 0.0–0.2)

## 2020-04-16 LAB — URINALYSIS, ROUTINE W REFLEX MICROSCOPIC
Bilirubin Urine: NEGATIVE
Glucose, UA: NEGATIVE mg/dL
Hgb urine dipstick: NEGATIVE
Ketones, ur: NEGATIVE mg/dL
Leukocytes,Ua: NEGATIVE
Nitrite: NEGATIVE
Protein, ur: NEGATIVE mg/dL
Specific Gravity, Urine: 1.025 (ref 1.005–1.030)
pH: 5.5 (ref 5.0–8.0)

## 2020-04-16 LAB — LIPASE, BLOOD: Lipase: 35 U/L (ref 11–51)

## 2020-04-16 LAB — HCG, SERUM, QUALITATIVE: Preg, Serum: NEGATIVE

## 2020-04-16 NOTE — ED Provider Notes (Signed)
[facility] EMERGENCY DEPARTMENT Provider Note   CSN: [id number] Arrival date & time: 04/16/20  1547     History Chief Complaint  Patient presents with  . Weakness    Nicole White is a 30 y.o. female.  29 y.o female with a PMH of asthma, prediabetes presents to the ED with a chief complaint of generalized weakness since this morning at 9 AM.  Patient is currently employed as a tape measuring reader for work, states that it was "hard to look at the lines ", she felt overall weak and fatigued.  States that she has been feeling shaky which is new from her.  She did go home after work and took a nap which she then states made her feel even weaker.  She has been taking a new medication, states Claritin for her sinuses along with montelukast for her asthma control.  Does report her last menstrual period was over 3 months ago, states she is currently sexually active and took a pregnancy test last week which was negative.  She denies any other changes, changes to her diet, drug use, alcohol use.  No fever, abdominal pain, nausea, vomiting, chest pain, shortness of breath, urinary symptoms, gynecological complaints.  The history is provided by the patient.  Weakness Severity:  Mild Onset quality:  Sudden Duration:  7 hours Timing:  Constant Progression:  Worsening Chronicity:  New Context: not change in medication   Relieved by:  Lying down Worsened by:  Activity Ineffective treatments:  Rest and sleep Associated symptoms: no abdominal pain, no chest pain, no cough, no diarrhea, no drooling, no fever, no headaches, no lethargy, no loss of consciousness, no myalgias, no nausea and no vomiting   Risk factors: new medications   Risk factors: no coronary artery disease, no excessive menstruation and no family hx of stroke        Past Medical History:  Diagnosis Date  . Asthma   . Prediabetes     Patient Active Problem List   Diagnosis Date Noted  . Asthma exacerbation  12/21/2012  . Acute bronchitis with bronchospasm 12/21/2012  . Hypokalemia 12/21/2012  . Leukocytosis 12/21/2012  . Sinus tachycardia 12/21/2012    Past Surgical History:  Procedure Laterality Date  . NO PAST SURGERIES       OB History   No obstetric history on file.     Family History  Problem Relation Age of Onset  . Asthma Father   . Autism Brother     Social History   Tobacco Use  . Smoking status: Never Smoker  . Smokeless tobacco: Never Used  Vaping Use  . Vaping Use: Never used  Substance Use Topics  . Alcohol use: No  . Drug use: No    Home Medications Prior to Admission medications   Medication Sig Start Date End Date Taking? Authorizing Provider  albuterol (PROVENTIL HFA;VENTOLIN HFA) 108 (90 Base) MCG/ACT inhaler Inhale 2 puffs into the lungs every 4 (four) hours as needed for wheezing or shortness of breath. 01/18/18   Cristina Gong, PA-C  albuterol (PROVENTIL) (2.5 MG/3ML) 0.083% nebulizer solution Take 3 mLs (2.5 mg total) by nebulization every 4 (four) hours as needed for wheezing or shortness of breath. 01/18/18   Cristina Gong, PA-C  cyclobenzaprine (FLEXERIL) 10 MG tablet Take 1 tablet (10 mg total) by mouth 2 (two) times daily as needed for muscle spasms. 04/11/18   Roxy Horseman, PA-C  HYDROcodone-acetaminophen (NORCO/VICODIN) 5-325 MG tablet Take 1-2 tablets by mouth  every 6 (six) hours as needed. 04/11/18   Roxy HorsemanBrowning, Robert, PA-C  ibuprofen (ADVIL,MOTRIN) 800 MG tablet Take 1 tablet (800 mg total) by mouth 3 (three) times daily. 04/11/18   Roxy HorsemanBrowning, Robert, PA-C  naproxen sodium (ANAPROX) 220 MG tablet Take 220 mg by mouth every 12 (twelve) hours as needed (for pain).    [provider]  omeprazole (PRILOSEC) 20 MG capsule Take 20 mg by mouth daily. 12/01/17 04/11/18  [provider]    Allergies    Coconut flavor, Other, Peanut-containing drug products, and Shellfish allergy  Review of Systems   Review of Systems   Constitutional: Negative for fever.  HENT: Negative for drooling.   Respiratory: Negative for cough.   Cardiovascular: Negative for chest pain.  Gastrointestinal: Negative for abdominal pain, diarrhea, nausea and vomiting.  Genitourinary: Negative for difficulty urinating and flank pain.  Musculoskeletal: Negative for back pain and myalgias.  Skin: Negative for pallor and wound.  Neurological: Positive for weakness. Negative for loss of consciousness and headaches.  All other systems reviewed and are negative.   Physical Exam Updated Vital Signs BP 107/72   Pulse 81   Temp 98.5 F (36.9 C) (Oral)   Resp (!) 27   Ht 5\' 2"  (1.575 m)   Wt 73.9 kg   SpO2 100%   BMI 29.81 kg/m   Physical Exam Vitals and nursing note reviewed.  Constitutional:      Appearance: Normal appearance. She is not ill-appearing.  HENT:     Head: Normocephalic and atraumatic.     Nose: Nose normal.     Mouth/Throat:     Mouth: Mucous membranes are moist.  Eyes:     Pupils: Pupils are equal, round, and reactive to light.  Cardiovascular:     Rate and Rhythm: Normal rate.  Pulmonary:     Effort: Pulmonary effort is normal.     Breath sounds: Examination of the right-lower field reveals decreased breath sounds. Examination of the left-lower field reveals decreased breath sounds. Decreased breath sounds present.  Abdominal:     General: Abdomen is flat. Bowel sounds are normal.     Palpations: Abdomen is soft.     Tenderness: There is no abdominal tenderness. There is no right CVA tenderness or left CVA tenderness.     Comments: Abdomen is soft, nontender to palpation, bowel sounds are present and equal throughout all quadrants.  Musculoskeletal:     Cervical back: Normal range of motion and neck supple.  Skin:    General: Skin is warm and dry.  Neurological:     Mental Status: She is alert and oriented to person, place, and time.     Comments: Alert, oriented, thought content appropriate. Speech  fluent without evidence of aphasia. Able to follow 2 step commands without difficulty.  Cranial Nerves:  II:  Peripheral visual fields grossly normal, pupils, round, reactive to light III,IV, VI: ptosis not present, extra-ocular motions intact bilaterally  V,VII: smile symmetric, facial light touch sensation equal VIII: hearing grossly normal bilaterally  IX,X: midline uvula rise  XI: bilateral shoulder shrug equal and strong XII: midline tongue extension  Motor:  5/5 in upper and lower extremities bilaterally including strong and equal grip strength and dorsiflexion/plantar flexion Sensory: light touch normal in all extremities.  Cerebellar: normal finger-to-nose with bilateral upper extremities, pronator drift negative Gait: normal gait and balance      ED Results / Procedures / Treatments   Labs (all labs ordered are listed, but only abnormal  results are displayed) Labs Reviewed  CBC WITH DIFFERENTIAL/PLATELET - Abnormal; Notable for the following components:      Result Value   MCV 79.8 (*)    All other components within normal limits  COMPREHENSIVE METABOLIC PANEL - Abnormal; Notable for the following components:   Total Bilirubin 0.1 (*)    All other components within normal limits  CBG MONITORING, ED - Abnormal; Notable for the following components:   Glucose-Capillary 121 (*)    All other components within normal limits  LIPASE, BLOOD  URINALYSIS, ROUTINE W REFLEX MICROSCOPIC  HCG, SERUM, QUALITATIVE  CBG MONITORING, ED    EKG EKG Interpretation  Date/Time:  Wednesday April 16 2020 17:52:03 EDT Ventricular Rate:  80 PR Interval:  147 QRS Duration: 76 QT Interval:  360 QTC Calculation: 416 R Axis:   67 Text Interpretation: Sinus rhythm No significant change since last tracing Confirmed by Alvira Monday ([id number]) on 04/16/2020 6:12:28 PM   Radiology DG Chest 2 View  Result Date: 04/16/2020 CLINICAL DATA:  Asthma. EXAM: CHEST - 2 VIEW COMPARISON:  March 03, 2020. FINDINGS: The heart size and mediastinal contours are within normal limits. Both lungs are clear. No visible pleural effusions or pneumothorax. No acute osseous abnormality. IMPRESSION: No active cardiopulmonary disease. Electronically Signed   By: Feliberto Harts MD   On: 04/16/2020 17:54    Procedures Procedures   Medications Ordered in ED Medications - No data to display  ED Course  I have reviewed the triage vital signs and the nursing notes.  Pertinent labs & imaging results that were available during my care of the patient were reviewed by me and considered in my medical decision making (see chart for details).    MDM Rules/Calculators/A&P     Patient presents to the ED chief complaint of generalized weakness which began while she was at work this morning, does have a prior history of asthma, reports she has had some addition of medication such as Claritin, montelukast that she has now been taking.  She arrived in the ED with stable vital signs, blood pressure is within normal limits, heart rate slightly elevated at 93.  She denies any drug use, alcohol use, or any symptoms.  During evaluation patient is overall well-appearing, lungs are clear to auscultation with some decrease throughout the base of the lungs.  Abdomen is soft and nontender to palpation with bowel sounds in all 4 quadrants.  No bilateral leg swelling, no pitting edema noted.  She is neurologically intact, denies any headache on today's visit or fever or neck pain.  Interpretation of her labs reveal a CBC without any leukocytosis her hemoglobin is stable.  CMP without any electrolyte derangement, creatinine level is within normal limits.  LFTs are unremarkable.  No episodes of nausea or emesis.  Denies any abdominal pain at this time, urinary symptoms.  UA without any nitrites, leukocytes or ketones present.  Her pregnancy test is negative.  Her lipase level is within normal limits.  And was reevaluated by me  and reports improvement in symptoms, states that she does not have any complaint and just feels overall fatigued.  X-ray of her chest without any acute finding.  She denies any headache, changes in vision, neck rigidity, gynecological complaints, sick contacts.  She was offered a COVID-19 test during her visit, this was deferred per patient's request at this time.  Her vitals are within normal limits.  We discussed symptomatic treatment with ibuprofen and Tylenol, she is to return if  anything changes or she develops a fever.  Return precautions discussed at length, patient stable for discharge.   Portions of this note were generated with Scientist, clinical (histocompatibility and immunogenetics). Dictation errors may occur despite best attempts at proofreading.  Final Clinical Impression(s) / ED Diagnoses Final diagnoses:  Generalized weakness    Rx / DC Orders ED Discharge Orders    None       Claude Manges, PA-C 04/16/20 Berna Spare    Alvira Monday, MD 04/17/20 1217

## 2020-04-16 NOTE — ED Triage Notes (Signed)
Pt c/o weakness, dizziness, shaking since ~9am-states she feels her "blood sugar is too low or too high"-reports she is prediabetic-NAD-steady gait

## 2020-04-16 NOTE — Discharge Instructions (Addendum)
Your laboratory results were within normal limits. We discussed return precautions.   If you experience a fever, any new symptoms please return to the emergency department.

## 2020-04-23 ENCOUNTER — Emergency Department (HOSPITAL_BASED_OUTPATIENT_CLINIC_OR_DEPARTMENT_OTHER)
Admission: EM | Admit: 2020-04-23 | Discharge: 2020-04-23 | Disposition: A | Discharge status: Home / Self Care | Payer: Medicaid Other | Attending: Emergency Medicine | Admitting: Emergency Medicine

## 2020-04-23 ENCOUNTER — Other Ambulatory Visit: Payer: Self-pay

## 2020-04-23 ENCOUNTER — Encounter (HOSPITAL_BASED_OUTPATIENT_CLINIC_OR_DEPARTMENT_OTHER): Payer: Self-pay

## 2020-04-23 DIAGNOSIS — Z20822 Contact with and (suspected) exposure to covid-19: Secondary | ICD-10-CM | POA: Insufficient documentation

## 2020-04-23 DIAGNOSIS — J45909 Unspecified asthma, uncomplicated: Secondary | ICD-10-CM | POA: Insufficient documentation

## 2020-04-23 DIAGNOSIS — Z91010 Allergy to peanuts: Secondary | ICD-10-CM | POA: Insufficient documentation

## 2020-04-23 DIAGNOSIS — J029 Acute pharyngitis, unspecified: Secondary | ICD-10-CM | POA: Insufficient documentation

## 2020-04-23 LAB — SARS CORONAVIRUS 2 (TAT 6-24 HRS): SARS Coronavirus 2: NEGATIVE

## 2020-04-23 LAB — GROUP A STREP BY PCR: Group A Strep by PCR: NOT DETECTED

## 2020-04-23 NOTE — ED Triage Notes (Signed)
Pt woke this am with a "swollen feeling" in her throat which caused her to have difficulty breathing. States that has now improved, but is having difficulty swallowing. NAD noted during triage. States started taking Singulair 3 days ago, has not taken her daily allergy medicine (claritin)

## 2020-04-23 NOTE — ED Provider Notes (Signed)
[facility] EMERGENCY DEPARTMENT Provider Note   CSN: [id number] Arrival date & time: 04/23/20  1003     History Chief Complaint  Patient presents with  . Dysphagia    Nicole White is a 29 y.o. female.  HPI      29yo female with history of asthma, prediabetes, presents with concern for sore throat.   Reports yesterday developed some sore throat, and today woke up with a swollen feeling. Felt like she had difficulty breathing for a little but reports resolution now.  No nausea, vomiting, rash.  Does not think she had any allergic triggers.  Started taking sinuair 3 days ago and stopped daily claritin.  Feels like throat is sore, pain with swallowing.  NO known fevers. Does have some body aches, congestion. No cough.   Was seen in the ED recently for fatigue, feeling shaky on 4/6.   Past Medical History:  Diagnosis Date  . Asthma   . Prediabetes     Patient Active Problem List   Diagnosis Date Noted  . Asthma exacerbation 12/21/2012  . Acute bronchitis with bronchospasm 12/21/2012  . Hypokalemia 12/21/2012  . Leukocytosis 12/21/2012  . Sinus tachycardia 12/21/2012    Past Surgical History:  Procedure Laterality Date  . NO PAST SURGERIES       OB History   No obstetric history on file.     Family History  Problem Relation Age of Onset  . Asthma Father   . Autism Brother     Social History   Tobacco Use  . Smoking status: Never Smoker  . Smokeless tobacco: Never Used  Vaping Use  . Vaping Use: Never used  Substance Use Topics  . Alcohol use: No  . Drug use: No    Home Medications Prior to Admission medications   Medication Sig Start Date End Date Taking? Authorizing Provider  albuterol (PROVENTIL HFA;VENTOLIN HFA) 108 (90 Base) MCG/ACT inhaler Inhale 2 puffs into the lungs every 4 (four) hours as needed for wheezing or shortness of breath. 01/18/18   Cristina Gong, PA-C  albuterol (PROVENTIL) (2.5 MG/3ML) 0.083% nebulizer solution  Take 3 mLs (2.5 mg total) by nebulization every 4 (four) hours as needed for wheezing or shortness of breath. 01/18/18   Cristina Gong, PA-C  cyclobenzaprine (FLEXERIL) 10 MG tablet Take 1 tablet (10 mg total) by mouth 2 (two) times daily as needed for muscle spasms. 04/11/18   Roxy Horseman, PA-C  HYDROcodone-acetaminophen (NORCO/VICODIN) 5-325 MG tablet Take 1-2 tablets by mouth every 6 (six) hours as needed. 04/11/18   Roxy Horseman, PA-C  ibuprofen (ADVIL,MOTRIN) 800 MG tablet Take 1 tablet (800 mg total) by mouth 3 (three) times daily. 04/11/18   Roxy Horseman, PA-C  naproxen sodium (ANAPROX) 220 MG tablet Take 220 mg by mouth every 12 (twelve) hours as needed (for pain).    [provider]  omeprazole (PRILOSEC) 20 MG capsule Take 20 mg by mouth daily. 12/01/17 04/11/18  [provider]    Allergies    Coconut flavor, Other, Peanut-containing drug products, and Shellfish allergy  Review of Systems   Review of Systems  Constitutional: Negative for fever.  HENT: Positive for congestion, sore throat and trouble swallowing (pain).   Respiratory: Negative for cough. Shortness of breath: this am but improved.   Cardiovascular: Negative for chest pain.  Gastrointestinal: Negative for abdominal pain, nausea and vomiting.  Musculoskeletal: Positive for myalgias.  Skin: Negative for rash.  Neurological: Negative for headaches (had headache but improved).  Physical Exam Updated Vital Signs BP 121/87 (BP Location: Right Arm)   Pulse 99   Temp 97.9 F (36.6 C)   Resp 16   Ht 5\' 2"  (1.575 m)   Wt 73.9 kg   SpO2 100%   BMI 29.81 kg/m   Physical Exam Vitals and nursing note reviewed.  Constitutional:      General: She is not in acute distress.    Appearance: She is well-developed. She is not diaphoretic.  HENT:     Head: Normocephalic and atraumatic.  Eyes:     Conjunctiva/sclera: Conjunctivae normal.  Cardiovascular:     Rate and Rhythm: Normal rate  and regular rhythm.     Heart sounds: Normal heart sounds. No murmur heard. No friction rub. No gallop.   Pulmonary:     Effort: Pulmonary effort is normal. No respiratory distress.     Breath sounds: Normal breath sounds. No wheezing or rales.  Abdominal:     General: There is no distension.     Palpations: Abdomen is soft.     Tenderness: There is no abdominal tenderness. There is no guarding.  Musculoskeletal:        General: No tenderness.     Cervical back: Normal range of motion.  Skin:    General: Skin is warm and dry.     Findings: No erythema or rash.  Neurological:     Mental Status: She is alert and oriented to person, place, and time.     ED Results / Procedures / Treatments   Labs (all labs ordered are listed, but only abnormal results are displayed) Labs Reviewed  SARS CORONAVIRUS 2 (TAT 6-24 HRS)  GROUP A STREP BY PCR    EKG None  Radiology No results found.  Procedures Procedures   Medications Ordered in ED Medications - No data to display  ED Course  I have reviewed the triage vital signs and the nursing notes.  Pertinent labs & imaging results that were available during my care of the patient were reviewed by me and considered in my medical decision making (see chart for details).    MDM Rules/Calculators/A&P                          29yo female with history of asthma, prediabetes, presents with concern for sore throat, throat swelling.   Had symptoms of throat swelling and dyspnea prior to arrival however no abnormalities on current exam--no sign of wheezing, stridor, oropharyngeal swelling, rash, do not see signs of anaphylaxis. No sign of RPA, PTA, epiglottitis. Suspect viral etiology of sore throat. Strep test ordered and pending (later negative) and COVID test ordered and pending. Recommend continued supportive care. Patient discharged in stable condition with understanding of reasons to return.    Final Clinical Impression(s) / ED  Diagnoses Final diagnoses:  Sore throat  Pharyngitis, unspecified etiology    Rx / DC Orders ED Discharge Orders    None       , MD 04/24/20 2238

## 2020-08-09 ENCOUNTER — Emergency Department (HOSPITAL_BASED_OUTPATIENT_CLINIC_OR_DEPARTMENT_OTHER)
Admission: EM | Admit: 2020-08-09 | Discharge: 2020-08-09 | Disposition: A | Discharge status: Home / Self Care | Payer: Medicaid Other | Attending: Emergency Medicine | Admitting: Emergency Medicine

## 2020-08-09 ENCOUNTER — Encounter (HOSPITAL_BASED_OUTPATIENT_CLINIC_OR_DEPARTMENT_OTHER): Payer: Self-pay | Admitting: Emergency Medicine

## 2020-08-09 ENCOUNTER — Other Ambulatory Visit: Payer: Self-pay

## 2020-08-09 DIAGNOSIS — J45901 Unspecified asthma with (acute) exacerbation: Secondary | ICD-10-CM | POA: Insufficient documentation

## 2020-08-09 DIAGNOSIS — Z91010 Allergy to peanuts: Secondary | ICD-10-CM | POA: Insufficient documentation

## 2020-08-09 DIAGNOSIS — K130 Diseases of lips: Secondary | ICD-10-CM

## 2020-08-09 MED ORDER — DOXYCYCLINE HYCLATE 100 MG PO CAPS: 100.0000 mg | ORAL_CAPSULE | Freq: Two times a day (BID) | ORAL | 0 refills | Status: AC

## 2020-08-09 NOTE — ED Triage Notes (Signed)
Pt had to get clear retainers in facial piercings yesterday for her job; her lower lip is swollen

## 2020-08-09 NOTE — ED Provider Notes (Signed)
[facility] EMERGENCY DEPARTMENT Provider Note   CSN: [id number] Arrival date & time: 08/09/20  1254     History Chief Complaint  Patient presents with   Oral Swelling    Nicole White is a 30 y.o. female.  The history is provided by the patient.  Dental Pain Toothache location: lower lip. Quality:  Throbbing Severity:  Moderate Onset quality:  Sudden Duration:  1 day Timing:  Constant Progression:  Worsening Chronicity:  New Context comment:  Had to have lower lip piercing replaced with a clear, plastic retainer; symptoms started after this procedure Relieved by:  Nothing Worsened by:  Nothing Ineffective treatments:  None tried Associated symptoms: facial swelling   Associated symptoms: no difficulty swallowing and no fever       Past Medical History:  Diagnosis Date   Asthma    Prediabetes     Patient Active Problem List   Diagnosis Date Noted   Asthma exacerbation 12/21/2012   Acute bronchitis with bronchospasm 12/21/2012   Hypokalemia 12/21/2012   Leukocytosis 12/21/2012   Sinus tachycardia 12/21/2012    Past Surgical History:  Procedure Laterality Date   NO PAST SURGERIES       OB History   No obstetric history on file.     Family History  Problem Relation Age of Onset   Asthma Father    Autism Brother     Social History   Tobacco Use   Smoking status: Never   Smokeless tobacco: Never  Vaping Use   Vaping Use: Never used  Substance Use Topics   Alcohol use: No   Drug use: No    Home Medications Prior to Admission medications   Medication Sig Start Date End Date Taking? Authorizing Provider  doxycycline (VIBRAMYCIN) 100 MG capsule Take 1 capsule (100 mg total) by mouth 2 (two) times daily for 7 days. 08/09/20 08/16/20 Yes Koleen Distance, MD  albuterol (PROVENTIL HFA;VENTOLIN HFA) 108 (90 Base) MCG/ACT inhaler Inhale 2 puffs into the lungs every 4 (four) hours as needed for wheezing or shortness of breath. 01/18/18   Cristina Gong, PA-C  albuterol (PROVENTIL) (2.5 MG/3ML) 0.083% nebulizer solution Take 3 mLs (2.5 mg total) by nebulization every 4 (four) hours as needed for wheezing or shortness of breath. 01/18/18   Cristina Gong, PA-C  cyclobenzaprine (FLEXERIL) 10 MG tablet Take 1 tablet (10 mg total) by mouth 2 (two) times daily as needed for muscle spasms. 04/11/18   Roxy Horseman, PA-C  HYDROcodone-acetaminophen (NORCO/VICODIN) 5-325 MG tablet Take 1-2 tablets by mouth every 6 (six) hours as needed. 04/11/18   Roxy Horseman, PA-C  ibuprofen (ADVIL,MOTRIN) 800 MG tablet Take 1 tablet (800 mg total) by mouth 3 (three) times daily. 04/11/18   Roxy Horseman, PA-C  naproxen sodium (ANAPROX) 220 MG tablet Take 220 mg by mouth every 12 (twelve) hours as needed (for pain).    [provider]  omeprazole (PRILOSEC) 20 MG capsule Take 20 mg by mouth daily. 12/01/17 04/11/18  [provider]    Allergies    Coconut flavor, Other, Peanut-containing drug products, and Shellfish allergy  Review of Systems   Review of Systems  Constitutional:  Negative for chills and fever.  HENT:  Positive for facial swelling. Negative for ear pain and sore throat.   Eyes:  Negative for pain and visual disturbance.  Respiratory:  Negative for cough and shortness of breath.   Cardiovascular:  Negative for chest pain and palpitations.  Gastrointestinal:  Negative for  abdominal pain and vomiting.  Genitourinary:  Negative for dysuria and hematuria.  Musculoskeletal:  Negative for arthralgias and back pain.  Skin:  Negative for color change and rash.  Neurological:  Negative for seizures and syncope.  All other systems reviewed and are negative.  Physical Exam Updated Vital Signs BP (!) 127/91 (BP Location: Right Arm)   Pulse 95   Temp 98.6 F (37 C) (Oral)   Resp 20   Ht 5\' 3"  (1.6 m)   Wt 75.8 kg   SpO2 98%   BMI 29.58 kg/m   Physical Exam Vitals and nursing note reviewed.  HENT:     Head:  Normocephalic and atraumatic.     Mouth/Throat:     Mouth: Mucous membranes are moist.     Pharynx: No oropharyngeal exudate or posterior oropharyngeal erythema.     Comments: Her entire lower lip is extremely swollen.  It is mildly erythematous and definitely tender diffusely. Eyes:     General: No scleral icterus. Pulmonary:     Effort: Pulmonary effort is normal. No respiratory distress.  Musculoskeletal:     Cervical back: Normal range of motion.  Skin:    General: Skin is warm and dry.  Neurological:     General: No focal deficit present.     Mental Status: She is alert and oriented to person, place, and time.  Psychiatric:        Mood and Affect: Mood normal.    ED Results / Procedures / Treatments   Labs (all labs ordered are listed, but only abnormal results are displayed) Labs Reviewed - No data to display  EKG None  Radiology No results found.  Procedures Procedures   Medications Ordered in ED Medications - No data to display  ED Course  I have reviewed the triage vital signs and the nursing notes.  Pertinent labs & imaging results that were available during my care of the patient were reviewed by me and considered in my medical decision making (see chart for details).    MDM Rules/Calculators/A&P                           Nicole White appears to have developed cellulitis of her lower lip as a result of manipulation of her piercing yesterday.  She will be prescribed antibiotics.  She was given instructions on symptomatic management such warm compresses. Final Clinical Impression(s) / ED Diagnoses Final diagnoses:  Cellulitis, lip    Rx / DC Orders ED Discharge Orders          Ordered    doxycycline (VIBRAMYCIN) 100 MG capsule  2 times daily        08/09/20 1419             08/11/20, MD 08/09/20 1422

## 2021-12-29 ENCOUNTER — Emergency Department (HOSPITAL_BASED_OUTPATIENT_CLINIC_OR_DEPARTMENT_OTHER): Payer: Medicaid Other

## 2021-12-29 ENCOUNTER — Other Ambulatory Visit: Payer: Self-pay

## 2021-12-29 ENCOUNTER — Emergency Department (HOSPITAL_BASED_OUTPATIENT_CLINIC_OR_DEPARTMENT_OTHER)
Admission: EM | Admit: 2021-12-29 | Discharge: 2021-12-29 | Disposition: A | Discharge status: Home / Self Care | Payer: Medicaid Other | Attending: Emergency Medicine | Admitting: Emergency Medicine

## 2021-12-29 ENCOUNTER — Encounter (HOSPITAL_BASED_OUTPATIENT_CLINIC_OR_DEPARTMENT_OTHER): Payer: Self-pay | Admitting: Emergency Medicine

## 2021-12-29 DIAGNOSIS — J4551 Severe persistent asthma with (acute) exacerbation: Secondary | ICD-10-CM | POA: Insufficient documentation

## 2021-12-29 DIAGNOSIS — R0602 Shortness of breath: Secondary | ICD-10-CM | POA: Diagnosis present

## 2021-12-29 DIAGNOSIS — Z91010 Allergy to peanuts: Secondary | ICD-10-CM | POA: Insufficient documentation

## 2021-12-29 LAB — CBC WITH DIFFERENTIAL/PLATELET
Abs Immature Granulocytes: 0.01 10*3/uL (ref 0.00–0.07)
Basophils Absolute: 0.1 10*3/uL (ref 0.0–0.1)
Basophils Relative: 1 %
Eosinophils Absolute: 0.3 10*3/uL (ref 0.0–0.5)
Eosinophils Relative: 3 %
HCT: 44.4 % (ref 36.0–46.0)
Hemoglobin: 15 g/dL (ref 12.0–15.0)
Immature Granulocytes: 0 %
Lymphocytes Relative: 32 %
Lymphs Abs: 2.7 10*3/uL (ref 0.7–4.0)
MCH: 27 pg (ref 26.0–34.0)
MCHC: 33.8 g/dL (ref 30.0–36.0)
MCV: 80 fL (ref 80.0–100.0)
Monocytes Absolute: 0.6 10*3/uL (ref 0.1–1.0)
Monocytes Relative: 7 %
Neutro Abs: 5 10*3/uL (ref 1.7–7.7)
Neutrophils Relative %: 57 %
Platelets: 295 10*3/uL (ref 150–400)
RBC: 5.55 MIL/uL — ABNORMAL HIGH (ref 3.87–5.11)
RDW: 13 % (ref 11.5–15.5)
WBC: 8.6 10*3/uL (ref 4.0–10.5)
nRBC: 0 % (ref 0.0–0.2)

## 2021-12-29 LAB — BASIC METABOLIC PANEL
Anion gap: 7 (ref 5–15)
BUN: 8 mg/dL (ref 6–20)
CO2: 21 mmol/L — ABNORMAL LOW (ref 22–32)
Calcium: 9 mg/dL (ref 8.9–10.3)
Chloride: 109 mmol/L (ref 98–111)
Creatinine, Ser: 0.74 mg/dL (ref 0.44–1.00)
GFR, Estimated: >60 mL/min (ref >60)
Glucose, Bld: 122 mg/dL — ABNORMAL HIGH (ref 70–99)
Potassium: 3.9 mmol/L (ref 3.5–5.1)
Sodium: 137 mmol/L (ref 135–145)

## 2021-12-29 MED ORDER — IPRATROPIUM-ALBUTEROL 0.5-2.5 (3) MG/3ML IN SOLN
3.0000 mL | Freq: Once | RESPIRATORY_TRACT | Status: AC
  Administered 2021-12-29: 6 mL via RESPIRATORY_TRACT
  Filled 2021-12-29: qty 3

## 2021-12-29 MED ORDER — ALBUTEROL SULFATE HFA 108 (90 BASE) MCG/ACT IN AERS
2.0000 | INHALATION_SPRAY | RESPIRATORY_TRACT | Status: DC | PRN
  Filled 2021-12-29: qty 6.7

## 2021-12-29 MED ORDER — ALBUTEROL SULFATE (2.5 MG/3ML) 0.083% IN NEBU: 2.5000 mg | INHALATION_SOLUTION | RESPIRATORY_TRACT | 12 refills | Status: DC | PRN

## 2021-12-29 MED ORDER — ALBUTEROL SULFATE (2.5 MG/3ML) 0.083% IN NEBU: 5.0000 mg | INHALATION_SOLUTION | Freq: Once | RESPIRATORY_TRACT | Status: DC

## 2021-12-29 MED ORDER — PREDNISONE 50 MG PO TABS
60.0000 mg | ORAL_TABLET | Freq: Once | ORAL | Status: AC
  Administered 2021-12-29: 60 mg via ORAL
  Filled 2021-12-29: qty 1

## 2021-12-29 MED ORDER — ALBUTEROL SULFATE (2.5 MG/3ML) 0.083% IN NEBU
5.0000 mg | INHALATION_SOLUTION | Freq: Once | RESPIRATORY_TRACT | Status: AC
  Administered 2021-12-29: 2.5 mg via RESPIRATORY_TRACT
  Filled 2021-12-29: qty 6

## 2021-12-29 MED ORDER — IPRATROPIUM-ALBUTEROL 0.5-2.5 (3) MG/3ML IN SOLN
RESPIRATORY_TRACT | Status: AC
  Filled 2021-12-29: qty 3

## 2021-12-29 MED ORDER — IPRATROPIUM-ALBUTEROL 0.5-2.5 (3) MG/3ML IN SOLN: 3.0000 mL | Freq: Once | RESPIRATORY_TRACT | Status: DC

## 2021-12-29 MED ORDER — MAGNESIUM SULFATE 2 GM/50ML IV SOLN
2.0000 g | Freq: Once | INTRAVENOUS | Status: AC
  Administered 2021-12-29: 2 g via INTRAVENOUS
  Filled 2021-12-29: qty 50

## 2021-12-29 MED ORDER — PREDNISONE 20 MG PO TABS: 40.0000 mg | ORAL_TABLET | Freq: Every day | ORAL | 0 refills | Status: DC

## 2021-12-29 NOTE — ED Triage Notes (Signed)
Pt c/o SHOB/asthma exacerbation since last pm; inhaler and neb not helping

## 2021-12-29 NOTE — ED Notes (Signed)
Pt in room, EDP is in room with her

## 2021-12-29 NOTE — ED Provider Notes (Signed)
[facility] EMERGENCY DEPARTMENT Provider Note   CSN: [id number] Arrival date & time: 12/29/21  1255     History  Chief Complaint  Patient presents with   Shortness of Breath    Nicole White is a 31 y.o. female.  Patient is a 31 year old female with a history of asthma who does not use any tobacco products and is not on any maintenance inhalers presenting today with worsening shortness of breath.  She reports for the last few months she has had issues with her asthma.  She has been using her albuterol inhaler at home and nebulizer but since last night her breathing has gotten worse.  Her husband reports she used her inhaler maybe 30 times overnight but she is just becoming more more short of breath.  She has not had a productive cough, fever, abdominal pain or vomiting does have some chest soreness.  No recent URI symptoms.  The history is provided by the patient and the spouse.  Shortness of Breath      Home Medications Prior to Admission medications   Medication Sig Start Date End Date Taking? Authorizing Provider  predniSONE (DELTASONE) 20 MG tablet Take 2 tablets (40 mg total) by mouth daily. 12/29/21  Yes Riaz Onorato, Alphonzo Lemmings, MD  albuterol (PROVENTIL HFA;VENTOLIN HFA) 108 (90 Base) MCG/ACT inhaler Inhale 2 puffs into the lungs every 4 (four) hours as needed for wheezing or shortness of breath. 01/18/18   Cristina Gong, PA-C  albuterol (PROVENTIL) (2.5 MG/3ML) 0.083% nebulizer solution Take 3 mLs (2.5 mg total) by nebulization every 4 (four) hours as needed for wheezing or shortness of breath. 12/29/21   Gwyneth Sprout, MD  cyclobenzaprine (FLEXERIL) 10 MG tablet Take 1 tablet (10 mg total) by mouth 2 (two) times daily as needed for muscle spasms. 04/11/18   Roxy Horseman, PA-C  HYDROcodone-acetaminophen (NORCO/VICODIN) 5-325 MG tablet Take 1-2 tablets by mouth every 6 (six) hours as needed. 04/11/18   Roxy Horseman, PA-C  ibuprofen (ADVIL,MOTRIN) 800 MG  tablet Take 1 tablet (800 mg total) by mouth 3 (three) times daily. 04/11/18   Roxy Horseman, PA-C  naproxen sodium (ANAPROX) 220 MG tablet Take 220 mg by mouth every 12 (twelve) hours as needed (for pain).    [provider]  omeprazole (PRILOSEC) 20 MG capsule Take 20 mg by mouth daily. 12/01/17 04/11/18  [provider]      Allergies    Coconut flavor, Other, Peanut-containing drug products, Shellfish allergy, Dexamethasone, and Methylprednisolone    Review of Systems   Review of Systems  Respiratory:  Positive for shortness of breath.     Physical Exam Updated Vital Signs BP 121/89   Pulse 96   Temp 98.4 F (36.9 C)   Resp (!) 24   Ht 5\' 2"  (1.575 m)   Wt 77.1 kg   SpO2 100%   BMI 31.09 kg/m  Physical Exam Vitals and nursing note reviewed.  Constitutional:      General: She is in acute distress.     Appearance: She is well-developed.  HENT:     Head: Normocephalic and atraumatic.  Eyes:     Pupils: Pupils are equal, round, and reactive to light.  Cardiovascular:     Rate and Rhythm: Normal rate and regular rhythm.     Heart sounds: Normal heart sounds. No murmur heard.    No friction rub.  Pulmonary:     Effort: Pulmonary effort is normal. Tachypnea present.     Breath sounds: Wheezing present.  No rales.  Abdominal:     General: Bowel sounds are normal. There is no distension.     Palpations: Abdomen is soft.     Tenderness: There is no abdominal tenderness. There is no guarding or rebound.  Musculoskeletal:        General: No tenderness. Normal range of motion.     Comments: No edema  Skin:    General: Skin is warm and dry.     Findings: No rash.  Neurological:     Mental Status: She is alert and oriented to person, place, and time.     Cranial Nerves: No cranial nerve deficit.  Psychiatric:        Behavior: Behavior normal.     ED Results / Procedures / Treatments   Labs (all labs ordered are listed, but only abnormal results are  displayed) Labs Reviewed  CBC WITH DIFFERENTIAL/PLATELET - Abnormal; Notable for the following components:      Result Value   RBC 5.55 (*)    All other components within normal limits  BASIC METABOLIC PANEL - Abnormal; Notable for the following components:   CO2 21 (*)    Glucose, Bld 122 (*)    All other components within normal limits    EKG None  Radiology DG Chest Port 1 View  Result Date: 12/29/2021 CLINICAL DATA:  Shortness of breath, asthma exacerbation EXAM: PORTABLE CHEST 1 VIEW COMPARISON:  10/29/2021 FINDINGS: The heart size and mediastinal contours are within normal limits. Both lungs are clear. The visualized skeletal structures are unremarkable. IMPRESSION: No acute abnormality of the lungs in AP portable projection. Electronically Signed   By: Jearld Lesch M.D.   On: 12/29/2021 13:47    Procedures Procedures    Medications Ordered in ED Medications  magnesium sulfate IVPB 2 g 50 mL (2 g Intravenous New Bag/Given 12/29/21 1338)  albuterol (VENTOLIN HFA) 108 (90 Base) MCG/ACT inhaler 2 puff (has no administration in time range)  ipratropium-albuterol (DUONEB) 0.5-2.5 (3) MG/3ML nebulizer solution 3 mL (6 mLs Nebulization Given 12/29/21 1328)  albuterol (PROVENTIL) (2.5 MG/3ML) 0.083% nebulizer solution 5 mg (2.5 mg Nebulization Given 12/29/21 1328)  predniSONE (DELTASONE) tablet 60 mg (60 mg Oral Given 12/29/21 1324)    ED Course/ Medical Decision Making/ A&P                           Medical Decision Making Amount and/or Complexity of Data Reviewed Labs: ordered. Decision-making details documented in ED Course. Radiology: ordered and independent interpretation performed. Decision-making details documented in ED Course.  Risk Prescription drug management.   Pt with multiple medical problems and comorbidities and presenting today with a complaint that caries a high risk for morbidity and mortality. Pt with typical asthma exacerbation  symptoms.  No  infectious sx, productive cough or other complaints.  Wheezing on exam with moderate distress with tachypnea, tachycardia but O2 sats preserved.  will give steroids, mag and albuterol/atrovent and recheck. 2:36 PM I independently interpreted patient's labs and CBC, BMP without acute findings.  I have independently visualized and interpreted pt's images today. Chest x-ray within normal limits.  After albuterol, Atrovent, magnesium and prednisone patient is now breathing much more comfortably.  No further wheezing on exam.  Patient appears stable for discharge home.  Patient was given albuterol inhaler here as well as a short course of prednisone and new refills of her albuterol nebs.  A transition of care consult was placed as patient  does not have a PCP and would most likely benefit from a long-term steroid inhaler.         Final Clinical Impression(s) / ED Diagnoses Final diagnoses:  Severe persistent asthma with exacerbation    Rx / DC Orders ED Discharge Orders          Ordered    albuterol (PROVENTIL) (2.5 MG/3ML) 0.083% nebulizer solution  Every 4 hours PRN        12/29/21 1436    predniSONE (DELTASONE) 20 MG tablet  Daily        12/29/21 1436              Gwyneth Sprout, MD 12/29/21 1436

## 2021-12-29 NOTE — Discharge Instructions (Signed)
Take your next dose of steroids tomorrow and use your albuterol as needed.  Return if your symptoms worsen.

## 2022-01-18 ENCOUNTER — Emergency Department (HOSPITAL_BASED_OUTPATIENT_CLINIC_OR_DEPARTMENT_OTHER)
Admission: EM | Admit: 2022-01-18 | Discharge: 2022-01-18 | Disposition: A | Discharge status: Home / Self Care | Payer: Medicaid Other | Attending: Emergency Medicine | Admitting: Emergency Medicine

## 2022-01-18 ENCOUNTER — Other Ambulatory Visit: Payer: Self-pay

## 2022-01-18 ENCOUNTER — Encounter (HOSPITAL_BASED_OUTPATIENT_CLINIC_OR_DEPARTMENT_OTHER): Payer: Self-pay | Admitting: Urology

## 2022-01-18 ENCOUNTER — Emergency Department (HOSPITAL_BASED_OUTPATIENT_CLINIC_OR_DEPARTMENT_OTHER): Payer: Medicaid Other

## 2022-01-18 DIAGNOSIS — J4541 Moderate persistent asthma with (acute) exacerbation: Secondary | ICD-10-CM

## 2022-01-18 DIAGNOSIS — Z7951 Long term (current) use of inhaled steroids: Secondary | ICD-10-CM | POA: Diagnosis not present

## 2022-01-18 DIAGNOSIS — R0602 Shortness of breath: Secondary | ICD-10-CM | POA: Diagnosis present

## 2022-01-18 DIAGNOSIS — Z7952 Long term (current) use of systemic steroids: Secondary | ICD-10-CM | POA: Insufficient documentation

## 2022-01-18 DIAGNOSIS — Z91010 Allergy to peanuts: Secondary | ICD-10-CM | POA: Insufficient documentation

## 2022-01-18 DIAGNOSIS — R000 Tachycardia, unspecified: Secondary | ICD-10-CM | POA: Diagnosis not present

## 2022-01-18 MED ORDER — ALBUTEROL SULFATE (2.5 MG/3ML) 0.083% IN NEBU: 2.5000 mg | INHALATION_SOLUTION | RESPIRATORY_TRACT | 12 refills | Status: AC | PRN

## 2022-01-18 MED ORDER — IPRATROPIUM BROMIDE 0.02 % IN SOLN: 0.5000 mg | Freq: Four times a day (QID) | RESPIRATORY_TRACT | 12 refills | Status: AC

## 2022-01-18 MED ORDER — IPRATROPIUM-ALBUTEROL 0.5-2.5 (3) MG/3ML IN SOLN
3.0000 mL | Freq: Once | RESPIRATORY_TRACT | Status: AC
  Administered 2022-01-18: 3 mL via RESPIRATORY_TRACT
  Filled 2022-01-18: qty 3

## 2022-01-18 MED ORDER — PREDNISONE 10 MG PO TABS: 50.0000 mg | ORAL_TABLET | Freq: Every day | ORAL | 0 refills | Status: AC

## 2022-01-18 MED ORDER — ALBUTEROL SULFATE HFA 108 (90 BASE) MCG/ACT IN AERS
2.0000 | INHALATION_SPRAY | Freq: Once | RESPIRATORY_TRACT | Status: AC
  Administered 2022-01-18: 2 via RESPIRATORY_TRACT
  Filled 2022-01-18: qty 6.7

## 2022-01-18 MED ORDER — ALBUTEROL SULFATE (2.5 MG/3ML) 0.083% IN NEBU
2.5000 mg | INHALATION_SOLUTION | Freq: Once | RESPIRATORY_TRACT | Status: AC
  Administered 2022-01-18: 2.5 mg via RESPIRATORY_TRACT
  Filled 2022-01-18: qty 3

## 2022-01-18 MED ORDER — PREDNISONE 50 MG PO TABS
60.0000 mg | ORAL_TABLET | Freq: Once | ORAL | Status: AC
  Administered 2022-01-18: 60 mg via ORAL
  Filled 2022-01-18: qty 1

## 2022-01-18 MED ORDER — ALBUTEROL SULFATE HFA 108 (90 BASE) MCG/ACT IN AERS: 1.0000 | INHALATION_SPRAY | Freq: Four times a day (QID) | RESPIRATORY_TRACT | 6 refills | Status: AC | PRN

## 2022-01-18 NOTE — ED Provider Notes (Signed)
[facility] EMERGENCY DEPARTMENT Provider Note   CSN: [id number] Arrival date & time: 01/18/22  1812     History  Chief Complaint  Patient presents with   Shortness of Breath    Nicole White is a 32 y.o. female.  32 year old female with history of asthma presents with acute exacerbation.  States that she started having an asthma attack earlier today, has been using her nebulizer and home inhalers without improvement.  Patient is provided with DuoNeb on arrival, reports improvement in symptoms.  Reports several admissions previously related to asthma, 1 prior intubation. States that she does not tolerate IM or IV steroids but can do p.o. prednisone. No recent URI symptoms, no fevers.       Home Medications Prior to Admission medications   Medication Sig Start Date End Date Taking? Authorizing Provider  albuterol (VENTOLIN HFA) 108 (90 Base) MCG/ACT inhaler Inhale 1-2 puffs into the lungs every 6 (six) hours as needed for wheezing or shortness of breath. 01/18/22  Yes Suella Broad A, PA-C  ipratropium (ATROVENT) 0.02 % nebulizer solution Take 2.5 mLs (0.5 mg total) by nebulization 4 (four) times daily. 01/18/22  Yes Tacy Learn, PA-C  predniSONE (DELTASONE) 10 MG tablet Take 5 tablets (50 mg total) by mouth daily for 5 days. 01/18/22 01/23/22 Yes Tacy Learn, PA-C  albuterol (PROVENTIL HFA;VENTOLIN HFA) 108 (90 Base) MCG/ACT inhaler Inhale 2 puffs into the lungs every 4 (four) hours as needed for wheezing or shortness of breath. 01/18/18   Lorin Glass, PA-C  albuterol (PROVENTIL) (2.5 MG/3ML) 0.083% nebulizer solution Take 3 mLs (2.5 mg total) by nebulization every 4 (four) hours as needed for wheezing or shortness of breath. 01/18/22   Tacy Learn, PA-C  cyclobenzaprine (FLEXERIL) 10 MG tablet Take 1 tablet (10 mg total) by mouth 2 (two) times daily as needed for muscle spasms. 04/11/18   Montine Circle, PA-C  HYDROcodone-acetaminophen (NORCO/VICODIN) 5-325 MG  tablet Take 1-2 tablets by mouth every 6 (six) hours as needed. 04/11/18   Montine Circle, PA-C  ibuprofen (ADVIL,MOTRIN) 800 MG tablet Take 1 tablet (800 mg total) by mouth 3 (three) times daily. 04/11/18   Montine Circle, PA-C  naproxen sodium (ANAPROX) 220 MG tablet Take 220 mg by mouth every 12 (twelve) hours as needed (for pain).    [provider]  omeprazole (PRILOSEC) 20 MG capsule Take 20 mg by mouth daily. 12/01/17 04/11/18  [provider]      Allergies    Coconut flavor, Other, Peanut-containing drug products, Shellfish allergy, Dexamethasone, and Methylprednisolone    Review of Systems   Review of Systems Negative except as per HPI Physical Exam Updated Vital Signs BP 130/84 (BP Location: Right Arm)   Pulse (!) 121   Temp 98.5 F (36.9 C) (Oral)   Resp 20   Ht 5\' 2"  (1.575 m)   Wt 80.3 kg   SpO2 97%   BMI 32.37 kg/m  Physical Exam Vitals and nursing note reviewed.  Constitutional:      General: She is not in acute distress.    Appearance: She is well-developed. She is not diaphoretic.  HENT:     Head: Normocephalic and atraumatic.  Cardiovascular:     Rate and Rhythm: Regular rhythm. Tachycardia present.  Pulmonary:     Effort: Pulmonary effort is normal.     Breath sounds: Decreased breath sounds and wheezing present.  Musculoskeletal:     Right lower leg: No edema.     Left  lower leg: No edema.  Skin:    General: Skin is warm and dry.     Findings: No erythema or rash.  Neurological:     Mental Status: She is alert and oriented to person, place, and time.  Psychiatric:        Behavior: Behavior normal.     ED Results / Procedures / Treatments   Labs (all labs ordered are listed, but only abnormal results are displayed) Labs Reviewed - No data to display  EKG None  Radiology DG Chest 2 View  Result Date: 01/18/2022 CLINICAL DATA:  Shortness of breath.  Asthma exacerbation. EXAM: CHEST - 2 VIEW COMPARISON:  AP chest  12/29/2021 FINDINGS: The heart size and mediastinal contours are within normal limits. Both lungs are clear. Minimal multilevel degenerative disc changes of the upper thoracic spine. IMPRESSION: No acute cardiopulmonary disease process. Electronically Signed   By: Neita Garnet M.D.   On: 01/18/2022 19:01    Procedures .Critical Care  Performed by: Jeannie Fend, PA-C Authorized by: Jeannie Fend, PA-C   Critical care provider statement:    Critical care time (minutes):  30   Critical care was time spent personally by me on the following activities:  Development of treatment plan with patient or surrogate, discussions with consultants, evaluation of patient's response to treatment, examination of patient, ordering and review of laboratory studies, ordering and review of radiographic studies, ordering and performing treatments and interventions, pulse oximetry, re-evaluation of patient's condition and review of old charts     Medications Ordered in ED Medications  ipratropium-albuterol (DUONEB) 0.5-2.5 (3) MG/3ML nebulizer solution 3 mL (3 mLs Nebulization Given 01/18/22 2127)  albuterol (PROVENTIL) (2.5 MG/3ML) 0.083% nebulizer solution 2.5 mg (2.5 mg Nebulization Given 01/18/22 2127)  albuterol (PROVENTIL) (2.5 MG/3ML) 0.083% nebulizer solution 2.5 mg (2.5 mg Nebulization Given 01/18/22 2155)  predniSONE (DELTASONE) tablet 60 mg (60 mg Oral Given 01/18/22 2210)  albuterol (PROVENTIL) (2.5 MG/3ML) 0.083% nebulizer solution 2.5 mg (2.5 mg Nebulization Given 01/18/22 2215)  albuterol (VENTOLIN HFA) 108 (90 Base) MCG/ACT inhaler 2 puff (2 puffs Inhalation Given 01/18/22 2301)    ED Course/ Medical Decision Making/ A&P                           Medical Decision Making Risk Prescription drug management.   This patient presents to the ED for concern of asthma exacerbation asthma., this involves an extensive number of treatment options, and is a complaint that carries with it a high risk of  complications and morbidity.  The differential diagnosis includes but not limited to asthma exacerbation   Co morbidities that complicate the patient evaluation  asthma   Additional history obtained:  Additional history obtained from significant other at bedside who contributes to history as above External records from outside source obtained and reviewed including prior treatment regimen including IV mag, albuterol, duoneb, prednisone    Consultations Obtained:  I requested consultation with the respiratory therapist,  and discussed lab and imaging findings as well as pertinent plan - they recommend: nebs   Problem List / ED Course / Critical interventions / Medication management  32 yo female with history of asthma presents with exacerbation, not relieved with home inhaler and albuterol nebulizer. Significant wheezing, diminished lung sounds on arrival, speaking in short sentences. Improved after several nebs, no longer diminished, no longer with insp wheezing. Ready for dc, will dc with atrovent, albuterol, prednisone. Sent home with inhaler  to use tonight until she is able to pick up her rx from pharmacy.  I ordered medication including prednisone, albuterol, atrovent  for wheezing, asthma exacerbation  Reevaluation of the patient after these medicines showed that the patient improved I have reviewed the patients home medicines and have made adjustments as needed   Social Determinants of Health:  No PCP   Test / Admission - Considered:  Consider IV Mg, symptoms improving, feeling ready for dc with return to ER precautions.          Final Clinical Impression(s) / ED Diagnoses Final diagnoses:  Moderate persistent asthma with exacerbation    Rx / DC Orders ED Discharge Orders          Ordered    predniSONE (DELTASONE) 10 MG tablet  Daily        01/18/22 2257    ipratropium (ATROVENT) 0.02 % nebulizer solution  4 times daily        01/18/22 2257    albuterol  (VENTOLIN HFA) 108 (90 Base) MCG/ACT inhaler  Every 6 hours PRN        01/18/22 2257    albuterol (PROVENTIL) (2.5 MG/3ML) 0.083% nebulizer solution  Every 4 hours PRN        01/18/22 2257              Tacy Learn, PA-C 01/18/22 2310    Tretha Sciara, MD 01/19/22 1511

## 2022-01-18 NOTE — ED Triage Notes (Signed)
Pt states had asthma attack this am and has had continued SOB  Using inhaler and nebs at home with little relief  Denies fever, reports congestion

## 2022-01-18 NOTE — ED Notes (Signed)
RT helped in getting patient out of car. Stated she has asthma and has been short of breath for 2 days. Has been using her MDI. Last used MDI a few minutes ago , currently has it in her hand. SAT 100%, HR 118. Patient registering and Triage RN aware

## 2022-01-18 NOTE — Discharge Instructions (Signed)
Medications as prescribed. Follow up with PCP, referral provided, I will request follow up with social work as discussed. ER for worsening or concerning symptoms.

## 2023-06-01 ENCOUNTER — Encounter (HOSPITAL_BASED_OUTPATIENT_CLINIC_OR_DEPARTMENT_OTHER): Payer: Self-pay | Admitting: Emergency Medicine

## 2023-06-01 ENCOUNTER — Emergency Department (HOSPITAL_BASED_OUTPATIENT_CLINIC_OR_DEPARTMENT_OTHER)
Admission: EM | Admit: 2023-06-01 | Discharge: 2023-06-01 | Disposition: A | Discharge status: Home / Self Care | Attending: Emergency Medicine | Admitting: Emergency Medicine

## 2023-06-01 ENCOUNTER — Other Ambulatory Visit: Payer: Self-pay

## 2023-06-01 DIAGNOSIS — J4521 Mild intermittent asthma with (acute) exacerbation: Secondary | ICD-10-CM | POA: Insufficient documentation

## 2023-06-01 DIAGNOSIS — Z91010 Allergy to peanuts: Secondary | ICD-10-CM | POA: Diagnosis not present

## 2023-06-01 DIAGNOSIS — R0602 Shortness of breath: Secondary | ICD-10-CM | POA: Diagnosis present

## 2023-06-01 MED ORDER — ALBUTEROL SULFATE HFA 108 (90 BASE) MCG/ACT IN AERS
1.0000 | INHALATION_SPRAY | Freq: Once | RESPIRATORY_TRACT | Status: AC
  Administered 2023-06-01: 1 via RESPIRATORY_TRACT
  Filled 2023-06-01: qty 6.7

## 2023-06-01 MED ORDER — PREDNISONE 50 MG PO TABS
60.0000 mg | ORAL_TABLET | Freq: Once | ORAL | Status: AC
  Administered 2023-06-01: 60 mg via ORAL
  Filled 2023-06-01: qty 1

## 2023-06-01 MED ORDER — KETOROLAC TROMETHAMINE 30 MG/ML IJ SOLN
15.0000 mg | Freq: Once | INTRAMUSCULAR | Status: AC
  Administered 2023-06-01: 15 mg via INTRAVENOUS
  Filled 2023-06-01: qty 1

## 2023-06-01 MED ORDER — SODIUM CHLORIDE 0.9 % IV BOLUS
1000.0000 mL | Freq: Once | INTRAVENOUS | Status: AC
  Administered 2023-06-01: 1000 mL via INTRAVENOUS

## 2023-06-01 MED ORDER — PROCHLORPERAZINE EDISYLATE 10 MG/2ML IJ SOLN
10.0000 mg | Freq: Once | INTRAMUSCULAR | Status: AC
  Administered 2023-06-01: 10 mg via INTRAVENOUS
  Filled 2023-06-01: qty 2

## 2023-06-01 MED ORDER — ALBUTEROL SULFATE (2.5 MG/3ML) 0.083% IN NEBU
2.5000 mg | INHALATION_SOLUTION | Freq: Once | RESPIRATORY_TRACT | Status: AC
  Administered 2023-06-01: 2.5 mg via RESPIRATORY_TRACT
  Filled 2023-06-01: qty 3

## 2023-06-01 MED ORDER — IPRATROPIUM-ALBUTEROL 0.5-2.5 (3) MG/3ML IN SOLN
3.0000 mL | Freq: Once | RESPIRATORY_TRACT | Status: AC
  Administered 2023-06-01: 3 mL via RESPIRATORY_TRACT
  Filled 2023-06-01: qty 3

## 2023-06-01 MED ORDER — DIPHENHYDRAMINE HCL 50 MG/ML IJ SOLN
12.5000 mg | Freq: Once | INTRAMUSCULAR | Status: AC
  Administered 2023-06-01: 12.5 mg via INTRAVENOUS
  Filled 2023-06-01: qty 1

## 2023-06-01 NOTE — ED Provider Notes (Signed)
 [place] [facility] AT [facility] Provider Note  CSN: [id number] Arrival date & time: 06/01/23 1820  Chief Complaint(s) Shortness of Breath  HPI Nicole White is a 33 y.o. female Struve asthma presenting to the emergency department with shortness of breath.  Patient reports that she has been having about a week of some shortness of breath, runny nose and sore throat, cough, congestion.  No sick contacts.  She went to outside hospital 2 days ago, was prescribed steroids but has not picked them up yet.  She also reports that she has been having headaches with her symptoms, reports despite taking Tylenol  and Motrin  does not really help.  No head injury.  No nausea or vomiting, fevers or chills, neck pain or stiffness.  No numbness or tingling, weakness.  No vision changes.   Past Medical History Past Medical History:  Diagnosis Date   Asthma    Prediabetes    Patient Active Problem List   Diagnosis Date Noted   Asthma exacerbation 12/21/2012   Acute bronchitis with bronchospasm 12/21/2012   Hypokalemia 12/21/2012   Leukocytosis 12/21/2012   Sinus tachycardia 12/21/2012   Home Medication(s) Prior to Admission medications   Medication Sig Start Date End Date Taking? Authorizing Provider  albuterol  (PROVENTIL  HFA;VENTOLIN  HFA) 108 (90 Base) MCG/ACT inhaler Inhale 2 puffs into the lungs every 4 (four) hours as needed for wheezing or shortness of breath. 01/18/18   Dwain Giovanni, PA-C  albuterol  (PROVENTIL ) (2.5 MG/3ML) 0.083% nebulizer solution Take 3 mLs (2.5 mg total) by nebulization every 4 (four) hours as needed for wheezing or shortness of breath. 01/18/22   Darlis Eisenmenger, PA-C  albuterol  (VENTOLIN  HFA) 108 (90 Base) MCG/ACT inhaler Inhale 1-2 puffs into the lungs every 6 (six) hours as needed for wheezing or shortness of breath. 01/18/22   Darlis Eisenmenger, PA-C  cyclobenzaprine  (FLEXERIL ) 10 MG tablet Take 1 tablet (10 mg total) by mouth 2 (two) times daily  as needed for muscle spasms. 04/11/18   Sherel Dikes, PA-C  HYDROcodone -acetaminophen  (NORCO/VICODIN) 5-325 MG tablet Take 1-2 tablets by mouth every 6 (six) hours as needed. 04/11/18   Sherel Dikes, PA-C  ibuprofen  (ADVIL ,MOTRIN ) 800 MG tablet Take 1 tablet (800 mg total) by mouth 3 (three) times daily. 04/11/18   Sherel Dikes, PA-C  ipratropium (ATROVENT ) 0.02 % nebulizer solution Take 2.5 mLs (0.5 mg total) by nebulization 4 (four) times daily. 01/18/22   Darlis Eisenmenger, PA-C  naproxen  sodium (ANAPROX ) 220 MG tablet Take 220 mg by mouth every 12 (twelve) hours as needed (for pain).    [provider]  omeprazole (PRILOSEC) 20 MG capsule Take 20 mg by mouth daily. 12/01/17 04/11/18  [provider]                                                                                                                                    Past Surgical History Past  Surgical History:  Procedure Laterality Date   NO PAST SURGERIES     Family History Family History  Problem Relation Age of Onset   Asthma Father    Autism Brother     Social History Social History   Tobacco Use   Smoking status: Never   Smokeless tobacco: Never  Vaping Use   Vaping status: Never Used  Substance Use Topics   Alcohol use: No   Drug use: No   Allergies Coconut flavoring agent (non-screening), Other, Peanut-containing drug products, Shellfish allergy, Dexamethasone , and Methylprednisolone   Review of Systems Review of Systems  All other systems reviewed and are negative.   Physical Exam Vital Signs  I have reviewed the triage vital signs BP (!) 133/101 (BP Location: Right Arm)   Pulse 87   Temp 98.6 F (37 C)   Resp (!) 24   SpO2 96%  Physical Exam Vitals and nursing note reviewed.  Constitutional:      General: She is not in acute distress.    Appearance: She is well-developed.  HENT:     Head: Normocephalic and atraumatic.     Mouth/Throat:     Mouth: Mucous membranes  are moist.  Eyes:     Pupils: Pupils are equal, round, and reactive to light.  Cardiovascular:     Rate and Rhythm: Normal rate and regular rhythm.     Heart sounds: No murmur heard. Pulmonary:     Effort: Pulmonary effort is normal. No respiratory distress.     Breath sounds: Examination of the right-upper field reveals wheezing. Examination of the left-upper field reveals wheezing. Examination of the right-middle field reveals wheezing. Examination of the left-middle field reveals wheezing. Examination of the right-lower field reveals wheezing. Examination of the left-lower field reveals wheezing. Wheezing present.  Abdominal:     General: Abdomen is flat.     Palpations: Abdomen is soft.     Tenderness: There is no abdominal tenderness.  Musculoskeletal:        General: No tenderness.     Right lower leg: No edema.     Left lower leg: No edema.  Skin:    General: Skin is warm and dry.  Neurological:     General: No focal deficit present.     Mental Status: She is alert. Mental status is at baseline.     Comments: Cranial nerves II through XII intact, strength 5 out of 5 in the bilateral upper and lower extremities, no sensory deficit to light touch, no dysmetria on finger-nose-finger testing, ambulatory with steady gait.   Psychiatric:        Mood and Affect: Mood normal.        Behavior: Behavior normal.     ED Results and Treatments Labs (all labs ordered are listed, but only abnormal results are displayed) Labs Reviewed - No data to display  Radiology No results found.  Pertinent labs & imaging results that were available during my care of the patient were reviewed by me and considered in my medical decision making (see MDM for details).  Medications Ordered in ED Medications  ipratropium-albuterol  (DUONEB) 0.5-2.5 (3) MG/3ML nebulizer solution 3 mL (3  mLs Nebulization Given 06/01/23 1838)  albuterol  (PROVENTIL ) (2.5 MG/3ML) 0.083% nebulizer solution 2.5 mg (2.5 mg Nebulization Given 06/01/23 1838)  sodium chloride  0.9 % bolus 1,000 mL (1,000 mLs Intravenous New Bag/Given 06/01/23 1935)  ketorolac (TORADOL) 30 MG/ML injection 15 mg (15 mg Intravenous Given 06/01/23 1936)  prochlorperazine (COMPAZINE) injection 10 mg (10 mg Intravenous Given 06/01/23 1935)  diphenhydrAMINE (BENADRYL) injection 12.5 mg (12.5 mg Intravenous Given 06/01/23 1937)  predniSONE  (DELTASONE ) tablet 60 mg (60 mg Oral Given 06/01/23 1933)  albuterol  (VENTOLIN  HFA) 108 (90 Base) MCG/ACT inhaler 1 puff (1 puff Inhalation Given 06/01/23 2016)                                                                                                                                     Procedures Procedures  (including critical care time)  Medical Decision Making / ED Course   MDM:  33 year old presenting to the emergency department with shortness of breath.  On exam, patient with wheezing.  Not in respiratory distress.  Received nebulization treatment with improvement.  Given symptoms, suspect URI with mild asthma exacerbation.  No focal pulmonary findings and patient having dry cough.  Do not think there is any need for chest x-ray at this time.  She reports feeling much better.  She is given prednisone  as well.  Recommended that she pick up her prednisone  prescription that was given to her couple days ago.    She also reports mild headache which has been persistent despite treatment.  No red flag symptoms.  No head injury.  Neurologic exam is normal.  Low concern for dangerous process such as subarachnoid hemorrhage, subdural hematoma, intracranial tumor.  No symptoms concerning for CNS infection.  She received migraine cocktail treatment with improvement in her headache and reports currently it is resolved.  Patient reports she feels much better. Will discharge patient to home. All  questions answered. Patient comfortable with plan of discharge. Return precautions discussed with patient and specified on the after visit summary.       Additional history obtained: -Additional history obtained from family -External records from outside source obtained and reviewed including: Chart review including previous notes, labs, imaging, consultation notes including outside er visit   Medicines ordered and prescription drug management: Meds ordered this encounter  Medications   ipratropium-albuterol  (DUONEB) 0.5-2.5 (3) MG/3ML nebulizer solution 3 mL   albuterol  (PROVENTIL ) (2.5 MG/3ML) 0.083% nebulizer solution 2.5 mg   sodium chloride  0.9 % bolus 1,000 mL   ketorolac (TORADOL) 30 MG/ML injection 15 mg   prochlorperazine (COMPAZINE) injection 10 mg   diphenhydrAMINE (BENADRYL) injection 12.5 mg  predniSONE  (DELTASONE ) tablet 60 mg   albuterol  (VENTOLIN  HFA) 108 (90 Base) MCG/ACT inhaler 1 puff    -I have reviewed the patients home medicines and have made adjustments as needed  Social Determinants of Health:  Diagnosis or treatment significantly limited by social determinants of health: obesity   Reevaluation: After the interventions noted above, I reevaluated the patient and found that their symptoms have improved  Co morbidities that complicate the patient evaluation  Past Medical History:  Diagnosis Date   Asthma    Prediabetes       Dispostion: Disposition decision including need for hospitalization was considered, and patient discharged from emergency department.    Final Clinical Impression(s) / ED Diagnoses Final diagnoses:  Mild intermittent asthma with exacerbation     This chart was dictated using voice recognition software.  Despite best efforts to proofread,  errors can occur which can change the documentation meaning.    Mordecai Applebaum, MD 06/01/23 2022

## 2023-06-01 NOTE — Discharge Instructions (Signed)
 We evaluated you for your shortness of breath and your headache.  We believe your symptoms are due to a virus that has caused a mild exacerbation of your asthma.  Your symptoms improved in the emergency department with breathing treatment.  Please pick up the prednisone  that you were prescribed at [facility].  We gave you a dose of this medicine today, so you do not need to pick this medicine up today and you can start taking it tomorrow.  Please follow-up closely with your primary doctor.  If you have any worsening symptoms such as increased shortness of breath, recurrent or severe headaches, vomiting, or any other concerning symptoms, please return to the emergency department.

## 2023-06-01 NOTE — ED Notes (Signed)
 Patient not taking steroids rx'd from previous visit at [facility].

## 2023-06-01 NOTE — ED Triage Notes (Signed)
 Dx w/ URI 5/19; on steroid and inhalers and is not improving; RT in to assess
